# Patient Record
Sex: Female | Born: 1950 | Race: White | Hispanic: No | Marital: Single | Smoking: Never smoker
Health system: Southern US, Community
[De-identification: ages and names within clinical notes are randomized; demographics above are authoritative.]

## PROBLEM LIST (undated history)

## (undated) DIAGNOSIS — E119 Type 2 diabetes mellitus without complications: Secondary | ICD-10-CM

## (undated) DIAGNOSIS — H3530 Unspecified macular degeneration: Secondary | ICD-10-CM

## (undated) DIAGNOSIS — N189 Chronic kidney disease, unspecified: Secondary | ICD-10-CM

## (undated) DIAGNOSIS — I10 Essential (primary) hypertension: Secondary | ICD-10-CM

## (undated) DIAGNOSIS — H35039 Hypertensive retinopathy, unspecified eye: Secondary | ICD-10-CM

## (undated) DIAGNOSIS — H269 Unspecified cataract: Secondary | ICD-10-CM

## (undated) HISTORY — DX: Hypertensive retinopathy, unspecified eye: H35.039

## (undated) HISTORY — DX: Chronic kidney disease, unspecified: N18.9

## (undated) HISTORY — DX: Type 2 diabetes mellitus without complications: E11.9

## (undated) HISTORY — PX: EYE SURGERY: SHX253

## (undated) HISTORY — PX: SPINAL FUSION: SHX223

## (undated) HISTORY — DX: Unspecified macular degeneration: H35.30

## (undated) HISTORY — PX: WRIST SURGERY: SHX841

## (undated) HISTORY — DX: Essential (primary) hypertension: I10

## (undated) HISTORY — PX: CATARACT EXTRACTION: SUR2

## (undated) HISTORY — DX: Unspecified cataract: H26.9

## (undated) HISTORY — PX: ANGIOPLASTY: SHX39

---

## 2017-04-01 DIAGNOSIS — N1831 Chronic kidney disease, stage 3a: Secondary | ICD-10-CM | POA: Insufficient documentation

## 2017-12-03 DIAGNOSIS — F3341 Major depressive disorder, recurrent, in partial remission: Secondary | ICD-10-CM | POA: Diagnosis not present

## 2017-12-03 DIAGNOSIS — E1169 Type 2 diabetes mellitus with other specified complication: Secondary | ICD-10-CM | POA: Diagnosis not present

## 2017-12-03 DIAGNOSIS — K219 Gastro-esophageal reflux disease without esophagitis: Secondary | ICD-10-CM | POA: Diagnosis not present

## 2017-12-03 DIAGNOSIS — K5790 Diverticulosis of intestine, part unspecified, without perforation or abscess without bleeding: Secondary | ICD-10-CM | POA: Diagnosis not present

## 2017-12-03 DIAGNOSIS — I10 Essential (primary) hypertension: Secondary | ICD-10-CM | POA: Diagnosis not present

## 2017-12-03 DIAGNOSIS — R011 Cardiac murmur, unspecified: Secondary | ICD-10-CM | POA: Diagnosis not present

## 2017-12-03 DIAGNOSIS — F411 Generalized anxiety disorder: Secondary | ICD-10-CM | POA: Diagnosis not present

## 2017-12-03 DIAGNOSIS — Z95820 Peripheral vascular angioplasty status with implants and grafts: Secondary | ICD-10-CM | POA: Diagnosis not present

## 2017-12-03 DIAGNOSIS — E785 Hyperlipidemia, unspecified: Secondary | ICD-10-CM | POA: Diagnosis not present

## 2017-12-03 DIAGNOSIS — N3281 Overactive bladder: Secondary | ICD-10-CM | POA: Diagnosis not present

## 2017-12-03 DIAGNOSIS — I2510 Atherosclerotic heart disease of native coronary artery without angina pectoris: Secondary | ICD-10-CM | POA: Diagnosis not present

## 2017-12-03 DIAGNOSIS — N184 Chronic kidney disease, stage 4 (severe): Secondary | ICD-10-CM | POA: Diagnosis not present

## 2018-01-15 ENCOUNTER — Encounter: Payer: Self-pay | Admitting: Cardiovascular Disease

## 2018-01-15 ENCOUNTER — Ambulatory Visit (INDEPENDENT_AMBULATORY_CARE_PROVIDER_SITE_OTHER): Payer: PPO | Admitting: Cardiovascular Disease

## 2018-01-15 DIAGNOSIS — E7800 Pure hypercholesterolemia, unspecified: Secondary | ICD-10-CM

## 2018-01-15 DIAGNOSIS — E118 Type 2 diabetes mellitus with unspecified complications: Secondary | ICD-10-CM | POA: Insufficient documentation

## 2018-01-15 DIAGNOSIS — I10 Essential (primary) hypertension: Secondary | ICD-10-CM | POA: Diagnosis not present

## 2018-01-15 DIAGNOSIS — I2510 Atherosclerotic heart disease of native coronary artery without angina pectoris: Secondary | ICD-10-CM | POA: Diagnosis not present

## 2018-01-15 DIAGNOSIS — Z794 Long term (current) use of insulin: Secondary | ICD-10-CM | POA: Diagnosis not present

## 2018-01-15 DIAGNOSIS — E785 Hyperlipidemia, unspecified: Secondary | ICD-10-CM | POA: Insufficient documentation

## 2018-01-15 NOTE — Assessment & Plan Note (Signed)
History of essential hypertension with blood pressure measured today at 138/62.  She is on enalapril and nifedipine.

## 2018-01-15 NOTE — Assessment & Plan Note (Signed)
Patient hyperlipidemia on statin therapy with recent lipid profile performed 12/03/2017 revealing to cholesterol 207, LDL 58 and HDL of 96.

## 2018-01-15 NOTE — Patient Instructions (Signed)
Medication Instructions:  Your physician recommends that you continue on your current medications as directed. Please refer to the Current Medication list given to you today.  If you need a refill on your cardiac medications before your next appointment, please call your pharmacy.   Lab work: none If you have labs (blood work) drawn today and your tests are completely normal, you will receive your results only by: Marland Kitchen MyChart Message (if you have MyChart) OR . A paper copy in the mail If you have any lab test that is abnormal or we need to change your treatment, we will call you to review the results.  Testing/Procedures: none  Follow-Up: At [facility], you and your health needs are our priority.  As part of our continuing mission to provide you with exceptional heart care, we have created designated Provider Care Teams.  These Care Teams include your primary Cardiologist (physician) and Advanced Practice Providers (APPs -  Physician Assistants and Nurse Practitioners) who all work together to provide you with the care you need, when you need it. You will need a follow up appointment in 12 months.  Please call our office 2 months in advance to schedule this appointment.  You may see Dr. Gwenlyn Found or one of the following Advanced Practice Providers on your designated Care Team:   Kerin Ransom, PA-C Roby Lofts, [place] . Sande Rives, PA-C  Any Other Special Instructions Will Be Listed Below (If Applicable).

## 2018-01-15 NOTE — Assessment & Plan Note (Signed)
History of CAD status post stenting with Taxus express drug-eluting stent at [facility] prior to 2010 with stenting again in the LAD 2/29/2010 with a 2.5 mm x 12 mm long Endeavor DES and a 3 mm x 18 mm long Endeavor DES.  She has never had a heart attack.  She denies chest pain or shortness of breath.  She had a recent 2D echo performed by her cardiologist in the [place] that was essentially normal as well as a Myoview stress test that was normal as well 08/26/2017.

## 2018-01-15 NOTE — Progress Notes (Signed)
01/15/2018 Kimberly Rivers   [birth year]  [id number]  Primary Physician No primary care provider on file. Primary Cardiologist: Lorretta Harp MD [name], [place], [place]  HPI:  Kimberly Rivers is a 67 y.o. mildly overweight single Caucasian female with no children who recently relocated from the [place] to [facility] because of retirement and cost of living.  She was referred by Dr. Lynelle Doctor, her PCP, to be established in my practice for ongoing cardiovascular care.  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  Risk factors include treated hypertension, diabetes and lipidemia.  Her mother did have a CABG.  She is never had a heart attack or stroke.  She had stenting at [facility] before 2010 with a Taxus express drug-eluting stent and then stenting 2/29/2010 with 2 Endeavor stents in her LAD.  She had a normal 2D echo and Myoview stress test by her cardiologist in [place] 08/26/2017.  She denies chest pain or shortness of breath.  Current Meds  Medication Sig  . aspirin EC 81 MG tablet Take 81 mg by mouth daily.  . citalopram (CELEXA) 40 MG tablet Take 40 mg by mouth daily.  Marland Kitchen CRANBERRY PO Take 4,200 capsules by mouth daily.  . enalapril (VASOTEC) 20 MG tablet Take 1 tablet by mouth daily.  . Ferrous Sulfate (IRON) 325 (65 Fe) MG TABS Take 1 tablet by mouth daily.  Marland Kitchen gabapentin (NEURONTIN) 600 MG tablet Take 600 mg by mouth at bedtime.  . Lactobacillus (PROBIOTIC ACIDOPHILUS PO) Take by mouth.  . Multiple Vitamin (MULTIVITAMIN) tablet Take 1 tablet by mouth daily.  Marland Kitchen NIFEdipine (ADALAT CC) 60 MG 24 hr tablet Take 60 mg by mouth daily.  Marland Kitchen oxybutynin (DITROPAN-XL) 5 MG 24 hr tablet Take 5 mg by mouth at bedtime.  . ranitidine (ZANTAC) 150 MG tablet Take 150 mg by mouth as directed.  . simvastatin (ZOCOR) 10 MG tablet Take 10 mg by mouth daily.  . vitamin C (ASCORBIC ACID) 500 MG tablet Take 500 mg by mouth daily.     Allergies  Allergen Reactions  .  Clindamycin/Lincomycin   . Doxycycline   . Penicillins     Social History   Socioeconomic History  . Marital status: Single    Spouse name: Not on file  . Number of children: Not on file  . Years of education: Not on file  . Highest education level: Not on file  Occupational History  . Not on file  Social Needs  . Financial resource strain: Not on file  . Food insecurity:    Worry: Not on file    Inability: Not on file  . Transportation needs:    Medical: Not on file    Non-medical: Not on file  Tobacco Use  . Smoking status: Never Smoker  . Smokeless tobacco: Never Used  Substance and Sexual Activity  . Alcohol use: Not on file  . Drug use: Not on file  . Sexual activity: Not on file  Lifestyle  . Physical activity:    Days per week: Not on file    Minutes per session: Not on file  . Stress: Not on file  Relationships  . Social connections:    Talks on phone: Not on file    Gets together: Not on file    Attends religious service: Not on file    Active member of club or organization: Not on file    Attends meetings of  clubs or organizations: Not on file    Relationship status: Not on file  . Intimate partner violence:    Fear of current or ex partner: Not on file    Emotionally abused: Not on file    Physically abused: Not on file    Forced sexual activity: Not on file  Other Topics Concern  . Not on file  Social History Narrative  . Not on file     Review of Systems: General: negative for chills, fever, night sweats or weight changes.  Cardiovascular: negative for chest pain, dyspnea on exertion, edema, orthopnea, palpitations, paroxysmal nocturnal dyspnea or shortness of breath Dermatological: negative for rash Respiratory: negative for cough or wheezing Urologic: negative for hematuria Abdominal: negative for nausea, vomiting, diarrhea, bright red blood per rectum, melena, or hematemesis Neurologic: negative for visual changes, syncope, or  dizziness All other systems reviewed and are otherwise negative except as noted above.    Blood pressure 138/62, pulse (!) 46, height 4\' 11"  (1.499 m), weight 112 lb 9.6 oz (51.1 kg).  General appearance: alert and no distress Neck: no adenopathy, no carotid bruit, no JVD, supple, symmetrical, trachea midline and thyroid not enlarged, symmetric, no tenderness/mass/nodules Lungs: clear to auscultation bilaterally Heart: regular rate and rhythm, S1, S2 normal, no murmur, click, rub or gallop Extremities: extremities normal, atraumatic, no cyanosis or edema Pulses: 2+ and symmetric Skin: Skin color, texture, turgor normal. No rashes or lesions Neurologic: Alert and oriented X 3, normal strength and tone. Normal symmetric reflexes. Normal coordination and gait  EKG sinus bradycardia at 46 with right bundle branch block.  I personally reviewed this EKG.  ASSESSMENT AND PLAN:   Coronary artery disease History of CAD status post stenting with Taxus express drug-eluting stent at [facility] prior to 2010 with stenting again in the LAD 2/29/2010 with a 2.5 mm x 12 mm long Endeavor DES and a 3 mm x 18 mm long Endeavor DES.  She has never had a heart attack.  She denies chest pain or shortness of breath.  She had a recent 2D echo performed by her cardiologist in the [place] that was essentially normal as well as a Myoview stress test that was normal as well 08/26/2017.  Essential hypertension History of essential hypertension with blood pressure measured today at 138/62.  She is on enalapril and nifedipine.  Hyperlipidemia Patient hyperlipidemia on statin therapy with recent lipid profile performed 12/03/2017 revealing to cholesterol 207, LDL 58 and HDL of 96.      Lorretta Harp MD FACP,FACC,FAHA, [facility] 01/15/2018 9:45 AM

## 2018-02-26 DIAGNOSIS — E1122 Type 2 diabetes mellitus with diabetic chronic kidney disease: Secondary | ICD-10-CM | POA: Diagnosis not present

## 2018-02-26 DIAGNOSIS — I129 Hypertensive chronic kidney disease with stage 1 through stage 4 chronic kidney disease, or unspecified chronic kidney disease: Secondary | ICD-10-CM | POA: Diagnosis not present

## 2018-02-26 DIAGNOSIS — N183 Chronic kidney disease, stage 3 (moderate): Secondary | ICD-10-CM | POA: Diagnosis not present

## 2018-02-26 DIAGNOSIS — I2510 Atherosclerotic heart disease of native coronary artery without angina pectoris: Secondary | ICD-10-CM | POA: Diagnosis not present

## 2018-03-04 DIAGNOSIS — F411 Generalized anxiety disorder: Secondary | ICD-10-CM | POA: Diagnosis not present

## 2018-03-04 DIAGNOSIS — I10 Essential (primary) hypertension: Secondary | ICD-10-CM | POA: Diagnosis not present

## 2018-03-04 DIAGNOSIS — K5790 Diverticulosis of intestine, part unspecified, without perforation or abscess without bleeding: Secondary | ICD-10-CM | POA: Diagnosis not present

## 2018-03-04 DIAGNOSIS — Z7984 Long term (current) use of oral hypoglycemic drugs: Secondary | ICD-10-CM | POA: Diagnosis not present

## 2018-03-04 DIAGNOSIS — N184 Chronic kidney disease, stage 4 (severe): Secondary | ICD-10-CM | POA: Diagnosis not present

## 2018-03-04 DIAGNOSIS — Z95820 Peripheral vascular angioplasty status with implants and grafts: Secondary | ICD-10-CM | POA: Diagnosis not present

## 2018-03-04 DIAGNOSIS — I2510 Atherosclerotic heart disease of native coronary artery without angina pectoris: Secondary | ICD-10-CM | POA: Diagnosis not present

## 2018-03-04 DIAGNOSIS — E785 Hyperlipidemia, unspecified: Secondary | ICD-10-CM | POA: Diagnosis not present

## 2018-03-04 DIAGNOSIS — K219 Gastro-esophageal reflux disease without esophagitis: Secondary | ICD-10-CM | POA: Diagnosis not present

## 2018-03-04 DIAGNOSIS — E1169 Type 2 diabetes mellitus with other specified complication: Secondary | ICD-10-CM | POA: Diagnosis not present

## 2018-03-04 DIAGNOSIS — N3281 Overactive bladder: Secondary | ICD-10-CM | POA: Diagnosis not present

## 2018-03-04 DIAGNOSIS — F3341 Major depressive disorder, recurrent, in partial remission: Secondary | ICD-10-CM | POA: Diagnosis not present

## 2018-03-05 DIAGNOSIS — N183 Chronic kidney disease, stage 3 (moderate): Secondary | ICD-10-CM | POA: Diagnosis not present

## 2018-04-11 NOTE — Progress Notes (Signed)
[facility] Note  04/12/2018     CHIEF COMPLAINT Patient presents for Retina Evaluation and Diabetic Eye Exam   HISTORY OF PRESENT ILLNESS: Kimberly Rivers is a 68 y.o. female who presents to the clinic today for:   HPI    Retina Evaluation    In both eyes.  This started 1 month ago.  Associated Symptoms Floaters.  Negative for Glare, Shoulder/Hip pain, Blind Spot, Distortion, Photophobia, Jaw Claudication, Fatigue, Weight Loss, Scalp Tenderness, Redness, Flashes, Pain, Trauma and Fever.  Context:  distance vision, mid-range vision and near vision.  Treatments tried include surgery.  Response to treatment was mild improvement.  I, the attending physician,  performed the HPI with the patient and updated documentation appropriately.          Diabetic Eye Exam    Vision is stable.  Associated Symptoms Negative for Flashes, Pain, Trauma, Fever, Weight Loss, Scalp Tenderness, Redness, Floaters, Distortion, Photophobia, Jaw Claudication, Fatigue, Shoulder/Hip pain, Glare and Blind Spot.  Diabetes characteristics include Type 2, taking oral medications and controlled with diet.  This started 30 years ago.  Blood sugar level is controlled.  Last Blood Glucose 126.  Associated Diagnosis Kidney Disease.  I, the attending physician,  performed the HPI with the patient and updated documentation appropriately.          Comments    Referral of Dr. Baird Lyons for retina/DM Eval.Patient states she has had floaters OD for appx 3 yrs, denies flashes of light, ocular pain and photophobia.Pt is DM2 x 30 yrs's, BS 126 this am. A1C (unknown results ), WINL per patient. BS are controlled by diet, if BS above 200 she is to take Regaglinide 5mg . Pt is using Thera Tears and ReFresh gtt's PRN        Last edited by Bernarda Caffey, MD on 04/12/2018  2:51 PM. (History)    s/p cataract sx OS only around 2017 by Dr. Ernst Spell in [place]  Referring physician: Pa, [place] [address], [place] [address]  HISTORICAL INFORMATION:   Selected notes from the MEDICAL RECORD NUMBER Referred by [facility] for DM exam LEE: 06.17.19 (Dr. Nada Libman, MD) [BCVA: OD: 20/25 OS: 20/30--]  Ocular Hx-non-exu ARMD, cataract OS, pseudo OD PMH-DM, CKD, HTN    CURRENT MEDICATIONS: No current outpatient medications on file. (Ophthalmic Drugs)   No current facility-administered medications for this visit.  (Ophthalmic Drugs)   Current Outpatient Medications (Other)  Medication Sig  . aspirin EC 81 MG tablet Take 81 mg by mouth daily.  . citalopram (CELEXA) 40 MG tablet Take 40 mg by mouth daily.  Marland Kitchen CRANBERRY PO Take 4,200 capsules by mouth daily.  . enalapril (VASOTEC) 20 MG tablet Take 1 tablet by mouth daily.  . Ferrous Sulfate (IRON) 325 (65 Fe) MG TABS Take 1 tablet by mouth daily.  Marland Kitchen gabapentin (NEURONTIN) 600 MG tablet Take 600 mg by mouth at bedtime.  . Lactobacillus (PROBIOTIC ACIDOPHILUS PO) Take by mouth.  Marland Kitchen NIFEdipine (ADALAT CC) 60 MG 24 hr tablet Take 60 mg by mouth daily.  Marland Kitchen oxybutynin (DITROPAN-XL) 5 MG 24 hr tablet Take 5 mg by mouth at bedtime.  . ranitidine (ZANTAC) 150 MG tablet Take 150 mg by mouth as directed.  . simvastatin (ZOCOR) 10 MG tablet Take 10 mg by mouth daily.  . vitamin C (ASCORBIC ACID) 500 MG tablet Take 500 mg by mouth daily.  . Multiple Vitamin (MULTIVITAMIN)  tablet Take 1 tablet by mouth daily.   No current facility-administered medications for this visit.  (Other)      REVIEW OF SYSTEMS: ROS    Positive for: Endocrine, Eyes   Negative for: Constitutional, Gastrointestinal, Neurological, Skin, Genitourinary, Musculoskeletal, HENT, Cardiovascular, Respiratory, Psychiatric, Allergic/Imm, Heme/Lymph   Last edited by Zenovia Jordan, LPN on 1/61/0960  4:54 PM. (History)       ALLERGIES Allergies  Allergen Reactions  . Clindamycin/Lincomycin   . Doxycycline   . Penicillins      PAST MEDICAL HISTORY Past Medical History:  Diagnosis Date  . Chronic kidney disease   . Diabetes mellitus without complication ([place])   . Hypertension    Past Surgical History:  Procedure Laterality Date  . CATARACT EXTRACTION    . EYE SURGERY      FAMILY HISTORY Family History  Problem Relation Age of Onset  . Hypertension Mother   . Heart disease Mother   . Diabetes Mother   . [place] disease Father   . Heart disease Maternal Grandmother   . Heart disease Maternal Grandfather   . Kidney failure Maternal Grandfather     SOCIAL HISTORY Social History   Tobacco Use  . Smoking status: Never Smoker  . Smokeless tobacco: Never Used  Substance Use Topics  . Alcohol use: Not on file  . Drug use: Not on file         OPHTHALMIC EXAM:  Base Eye Exam    Visual Acuity (Snellen - Linear)      Right Left   Dist cc 20/40 +2 20/30 -2   Dist ph cc NI 20/25 -2   Correction:  Glasses       Tonometry (Tonopen, 2:14 PM)      Right Left   Pressure 14 10       Pupils      Dark Light Shape React APD   Right 4 3 Round Brisk None   Left 4 3 Round Brisk None       Visual Fields (Counting fingers)      Left Right    Full Full       Extraocular Movement      Right Left    Full, Ortho Full, Ortho       Neuro/Psych    Oriented x3:  Yes       Dilation    Both eyes:  1.0% Mydriacyl, 2.5% Phenylephrine @ 2:08 PM        Slit Lamp and Fundus Exam    Slit Lamp Exam      Right Left   Lids/Lashes Dermatochalasis - upper lid Dermatochalasis - upper lid   Conjunctiva/Sclera White and quiet White and quiet   Cornea Arcus, 1-2+ Punctate epithelial erosions Arcus, 1-2+ Punctate epithelial erosions, Well healed cataract wounds   Anterior Chamber deep and clear deep and clear   Iris Round and dilated, No NVI Round and dilated, No NVI   Lens 2-3+ Nuclear sclerosis, Cortical cataract PC IOL in good position with open PC   Vitreous Vitreous syneresis, Posterior vitreous  detachment Vitreous syneresis, Posterior vitreous detachment       Fundus Exam      Right Left   Disc Compact, Peripapillary atrophy Compact, mild tilt, temporal PPA   C/D Ratio 0.1 0.1   Macula Flat, Blunted foveal reflex, RPE atrophy and clumping, Drusen Blunted foveal reflex, fine drusen, mild Retinal pigment epithelial mottling, No heme or edema   Vessels Vascular attenuation, Tortuous, AV crossing  changes Vascular attenuation, Tortuous, AV crossing changes   Periphery Attached, No heme  Attached, No heme, peripheral cystoid degeneration          IMAGING AND PROCEDURES  Imaging and Procedures for @TODAY @  OCT, Retina - OU - Both Eyes       Right Eye Quality was good. Central Foveal Thickness: 286. Progression has no prior data. Findings include normal foveal contour, no SRF, no IRF, pigment epithelial detachment, outer retinal atrophy, retinal drusen .   Left Eye Quality was good. Central Foveal Thickness: 274. Progression has no prior data. Findings include normal foveal contour, no SRF, no IRF, retinal drusen .   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 CME - cystoid macular edema. PED - pigment epithelial detachment. IRF - intraretinal fluid. SRF - subretinal fluid. EZ - ellipsoid zone. ERM - epiretinal membrane. ORA - outer retinal atrophy. ORT - outer retinal tubulation. SRHM - subretinal hyper-reflective material                 ASSESSMENT/PLAN:    ICD-10-CM   1. Diabetes mellitus type 2 without retinopathy ([place]) E11.9   2. Intermediate stage nonexudative age-related macular degeneration of both eyes H35.3132   3. Retinal edema H35.81 OCT, Retina - OU - Both Eyes  4. 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 Essential hypertension I10   6.  Hypertensive retinopathy of both eyes H35.033   7. Pseudophakia of left eye Z96.1   8. 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 Diabetes mellitus, type 2 without retinopathy, OU  - The incidence, risk factors for progression, natural history and treatment options for diabetic retinopathy  were discussed with patient.    - The need for close monitoring of blood glucose, blood pressure, and serum lipids, avoiding cigarette or any type of tobacco, and the need for long term follow up was also discussed with patient.  - f/u in 1 year, sooner prn  2,3. Age related macular degeneration, non-exudative, both eyes  - The incidence, anatomy, and pathology of dry AMD, risk of progression, and the AREDS and AREDS 2 study including smoking risks discussed with patient.  - Recommend amsler grid monitoring  - f/u 4 months  4. Myopic degeneration OD  - mild low lying PED w/ overlying ORA nasal to fovea  - stable without IRF/SRF  - monitor  5,6. Hypertensive retinopathy OU - discussed importance of tight BP control - monitor  7. Pseudophakia OS  - s/p CE/IOL and YAG cap OS  - beautiful surgeries, doing well  - surgery done around 2017, Dr. Caleen Essex. Ernst Spell in [place]  - monitor  8. Mixed cataract OD - The symptoms of cataract, surgical options, and treatments and risks were discussed with patient. - discussed diagnosis and progression - approaching visual significance - will refer to Dr. Gala Romney at [facility] for evaluation   Ophthalmic Meds Ordered this visit:  No orders of the defined types were placed in this encounter.      Return in about 4 months (around 08/11/2018) for f/u non-exu ARMD OU, DFE, OCT.  There are no Patient Instructions on file for this visit.   Explained the diagnoses, plan, and follow up with  the patient and they expressed understanding.  Patient expressed understanding of the importance of proper follow up care.   This  document serves as a record of services personally performed by Gardiner Sleeper, MD, PhD. It was created on their behalf by Ernest Mallick, OA, an ophthalmic assistant. The creation of this record is the provider's dictation and/or activities during the visit.    Electronically signed by: Ernest Mallick, OA  01.26.2020 4:27 PM    Gardiner Sleeper, M.D., Ph.D. Diseases & Surgery of the Retina and Vitreous [facility]  I have reviewed the above documentation for accuracy and completeness, and I agree with the above. Gardiner Sleeper, M.D., Ph.D. 04/12/18 4:27 PM    Abbreviations: M myopia (nearsighted); A astigmatism; H hyperopia (farsighted); P presbyopia; Mrx spectacle prescription;  CTL contact lenses; OD right eye; OS left eye; OU both eyes  XT exotropia; ET esotropia; PEK punctate epithelial keratitis; PEE punctate epithelial erosions; DES dry eye syndrome; MGD meibomian gland dysfunction; ATs artificial tears; PFAT's preservative free artificial tears; [place] nuclear sclerotic cataract; PSC posterior subcapsular cataract; ERM epi-retinal membrane; PVD posterior vitreous detachment; RD retinal detachment; DM diabetes mellitus; DR diabetic retinopathy; NPDR non-proliferative diabetic retinopathy; PDR proliferative diabetic retinopathy; CSME clinically significant macular edema; DME diabetic macular edema; dbh dot blot hemorrhages; CWS cotton wool spot; POAG primary open angle glaucoma; C/D cup-to-disc ratio; HVF humphrey visual field; GVF goldmann visual field; OCT optical coherence tomography; IOP intraocular pressure; BRVO Branch retinal vein occlusion; CRVO central retinal vein occlusion; CRAO central retinal artery occlusion; BRAO branch retinal artery occlusion; RT retinal tear; SB scleral buckle; PPV pars plana vitrectomy; VH Vitreous hemorrhage; PRP panretinal laser photocoagulation; IVK intravitreal kenalog; VMT vitreomacular traction; MH Macular hole;  NVD neovascularization of  the disc; NVE neovascularization elsewhere; AREDS age related eye disease study; ARMD age related macular degeneration; POAG primary open angle glaucoma; EBMD epithelial/anterior basement membrane dystrophy; ACIOL anterior chamber intraocular lens; IOL intraocular lens; PCIOL posterior chamber intraocular lens; Phaco/IOL phacoemulsification with intraocular lens placement; [place] photorefractive keratectomy; LASIK laser assisted in situ keratomileusis; HTN hypertension; DM diabetes mellitus; COPD chronic obstructive pulmonary disease

## 2018-04-12 ENCOUNTER — Ambulatory Visit (INDEPENDENT_AMBULATORY_CARE_PROVIDER_SITE_OTHER): Payer: PPO | Admitting: Ophthalmology

## 2018-04-12 ENCOUNTER — Encounter (INDEPENDENT_AMBULATORY_CARE_PROVIDER_SITE_OTHER): Payer: Self-pay | Admitting: Ophthalmology

## 2018-04-12 DIAGNOSIS — Z961 Presence of intraocular lens: Secondary | ICD-10-CM | POA: Diagnosis not present

## 2018-04-12 DIAGNOSIS — H35033 Hypertensive retinopathy, bilateral: Secondary | ICD-10-CM | POA: Diagnosis not present

## 2018-04-12 DIAGNOSIS — H25811 Combined forms of age-related cataract, right eye: Secondary | ICD-10-CM

## 2018-04-12 DIAGNOSIS — I10 Essential (primary) hypertension: Secondary | ICD-10-CM

## 2018-04-12 DIAGNOSIS — E119 Type 2 diabetes mellitus without complications: Secondary | ICD-10-CM

## 2018-04-12 DIAGNOSIS — H353132 Nonexudative age-related macular degeneration, bilateral, intermediate dry stage: Secondary | ICD-10-CM | POA: Diagnosis not present

## 2018-04-12 DIAGNOSIS — H4421 Degenerative myopia, right eye: Secondary | ICD-10-CM

## 2018-04-12 DIAGNOSIS — H3581 Retinal edema: Secondary | ICD-10-CM | POA: Diagnosis not present

## 2018-04-26 DIAGNOSIS — H25811 Combined forms of age-related cataract, right eye: Secondary | ICD-10-CM | POA: Diagnosis not present

## 2018-04-26 DIAGNOSIS — Z961 Presence of intraocular lens: Secondary | ICD-10-CM | POA: Diagnosis not present

## 2018-04-26 DIAGNOSIS — H353132 Nonexudative age-related macular degeneration, bilateral, intermediate dry stage: Secondary | ICD-10-CM | POA: Diagnosis not present

## 2018-04-26 DIAGNOSIS — E119 Type 2 diabetes mellitus without complications: Secondary | ICD-10-CM | POA: Diagnosis not present

## 2018-08-10 NOTE — Progress Notes (Signed)
[facility] Note  08/11/2018     CHIEF COMPLAINT Patient presents for Retina Follow Up   HISTORY OF PRESENT ILLNESS: Kimberly Rivers is a 68 y.o. female who presents to the clinic today for:   HPI    Retina Follow Up    Patient presents with  Dry AMD.  In both eyes.  This started 5 months ago.  Since onset it is stable.  I, the attending physician,  performed the HPI with the patient and updated documentation appropriately.          Comments    4 mos F/U DM/ EXU/ ARMD OU. Patient states her vision has been "good", occasional floaters OD , denies new visual issues/onsets. Bs 117 , pt is schedules for labs/ A1C in two weeks.       Last edited by Bernarda Caffey, MD on 08/11/2018  2:06 PM. (History)    pt states she saw Dr. Eulas Post for cataract consult and they decided she doesn't need sx right now, pt states she is not taking vitamins for macula degeneration, pt states her nephrologist told her to stop taking multivitamins bc they were hurting her kidneys  Referring physician: Nada Libman, FNP [place], [place] [id number]  HISTORICAL INFORMATION:   Selected notes from the MEDICAL RECORD NUMBER Referred by [facility] for DM exam LEE: 06.17.19 (Dr. Nada Libman, MD) [BCVA: OD: 20/25 OS: 20/30--]  Ocular Hx-non-exu ARMD, cataract OS, pseudo OD PMH-DM, CKD, HTN    CURRENT MEDICATIONS: No current outpatient medications on file. (Ophthalmic Drugs)   No current facility-administered medications for this visit.  (Ophthalmic Drugs)   Current Outpatient Medications (Other)  Medication Sig  . aspirin EC 81 MG tablet Take 81 mg by mouth daily.  . citalopram (CELEXA) 40 MG tablet Take 40 mg by mouth daily.  Marland Kitchen CRANBERRY PO Take 4,200 capsules by mouth daily.  . enalapril (VASOTEC) 20 MG tablet Take 1 tablet by mouth daily.  . Ferrous Sulfate (IRON) 325 (65 Fe) MG TABS Take 1 tablet by mouth daily.  Marland Kitchen gabapentin (NEURONTIN) 600 MG  tablet Take 600 mg by mouth at bedtime.  . Lactobacillus (PROBIOTIC ACIDOPHILUS PO) Take by mouth.  . Multiple Vitamin (MULTIVITAMIN) tablet Take 1 tablet by mouth daily.  Marland Kitchen NIFEdipine (ADALAT CC) 60 MG 24 hr tablet Take 60 mg by mouth daily.  Marland Kitchen oxybutynin (DITROPAN-XL) 5 MG 24 hr tablet Take 5 mg by mouth at bedtime.  . ranitidine (ZANTAC) 150 MG tablet Take 150 mg by mouth as directed.  . simvastatin (ZOCOR) 10 MG tablet Take 10 mg by mouth daily.  . vitamin C (ASCORBIC ACID) 500 MG tablet Take 500 mg by mouth daily.   No current facility-administered medications for this visit.  (Other)      REVIEW OF SYSTEMS: ROS    Positive for: Endocrine, Eyes   Negative for: Constitutional, Gastrointestinal, Neurological, Skin, Genitourinary, Musculoskeletal, HENT, Cardiovascular, Respiratory, Psychiatric, Allergic/Imm, Heme/Lymph   Last edited by Zenovia Jordan, LPN on 9/51/8841  6:60 PM. (History)       ALLERGIES Allergies  Allergen Reactions  . Clindamycin/Lincomycin   . Doxycycline   . Penicillins     PAST MEDICAL HISTORY Past Medical History:  Diagnosis Date  . Chronic kidney disease   . Diabetes mellitus without complication ([place])   . Hypertension    Past Surgical History:  Procedure Laterality Date  . CATARACT EXTRACTION    . EYE SURGERY  FAMILY HISTORY Family History  Problem Relation Age of Onset  . Hypertension Mother   . Heart disease Mother   . Diabetes Mother   . Canavan disease Father   . Heart disease Maternal Grandmother   . Heart disease Maternal Grandfather   . Kidney failure Maternal Grandfather     SOCIAL HISTORY Social History   Tobacco Use  . Smoking status: Never Smoker  . Smokeless tobacco: Never Used  Substance Use Topics  . Alcohol use: Not on file  . Drug use: Not on file         OPHTHALMIC EXAM:  Base Eye Exam    Visual Acuity (Snellen - Linear)      Right Left   Dist cc 20/30 +2 20/40 +1   Dist ph cc 20/25 -1 20/30  +1   Correction:  Glasses       Tonometry (Tonopen, 1:25 PM)      Right Left   Pressure 12 14       Pupils      Dark Light Shape React APD   Right 4 3 Round Brisk None   Left 4 3 Round Brisk None       Visual Fields (Counting fingers)      Left Right    Full Full       Extraocular Movement      Right Left    Full, Ortho Full, Ortho       Neuro/Psych    Oriented x3:  Yes       Dilation    Both eyes:  1.0% Mydriacyl, 2.5% Phenylephrine @ 1:25 PM        Slit Lamp and Fundus Exam    Slit Lamp Exam      Right Left   Lids/Lashes Dermatochalasis - upper lid Dermatochalasis - upper lid   Conjunctiva/Sclera White and quiet White and quiet   Cornea Arcus, 1+ Punctate epithelial erosions, trace Descemet's folds Arcus, trace Punctate epithelial erosions, Well healed cataract wounds, trace Descemet's folds   Anterior Chamber deep and clear deep and clear   Iris Round and dilated, No NVI Round and dilated, No NVI   Lens 2-3+ Nuclear sclerosis, 2+Cortical cataract PC IOL in good position with PC opening   Vitreous Vitreous syneresis, Posterior vitreous detachment Vitreous syneresis, Posterior vitreous detachment       Fundus Exam      Right Left   Disc Compact, Peripapillary atrophy, Pink and Sharp Compact, mild tilt, temporal PPA   C/D Ratio 0.1 0.3   Macula Flat, Blunted foveal reflex, RPE mottling and clumping, focal atrophy nasal to disc, Drusen, no heme Blunted foveal reflex, fine drusen, mild Retinal pigment epithelial mottling, No heme or edema   Vessels Vascular attenuation, Tortuous Vascular attenuation, Tortuous, AV crossing changes   Periphery Attached, No heme  Attached, No heme, peripheral cystoid degeneration          IMAGING AND PROCEDURES  Imaging and Procedures for @TODAY @  OCT, Retina - OU - Both Eyes       Right Eye Quality was good. Central Foveal Thickness: 286. Progression has been stable. Findings include normal foveal contour, no SRF, no IRF,  pigment epithelial detachment, outer retinal atrophy, retinal drusen .   Left Eye Quality was good. Central Foveal Thickness: 273. Progression has been stable. Findings include normal foveal contour, no SRF, no IRF, retinal drusen .   Notes *Images captured and stored on drive  Diagnosis / Impression:  Non-exu ARMD OU (  OD > OS) OD with low lying PED and mild ORA nasal macula OS: fine drusen No DME OU  Clinical management:  See below  Abbreviations: NFP - Normal foveal profile. CME - cystoid macular edema. PED - pigment epithelial detachment. IRF - intraretinal fluid. SRF - subretinal fluid. EZ - ellipsoid zone. ERM - epiretinal membrane. ORA - outer retinal atrophy. ORT - outer retinal tubulation. SRHM - subretinal hyper-reflective material                 ASSESSMENT/PLAN:    ICD-10-CM   1. Diabetes mellitus type 2 without retinopathy ([place]) E11.9   2. Intermediate stage nonexudative age-related macular degeneration of both eyes H35.3132   3. Retinal edema H35.81 OCT, Retina - OU - Both Eyes  4. 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 Essential hypertension I10   6. Hypertensive retinopathy of both eyes H35.033   7. Pseudophakia of left eye Z96.1   8. 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 Diabetes mellitus, type 2 without retinopathy, OU  - The incidence, risk factors for progression, natural history and treatment options for diabetic retinopathy  were discussed with patient.    - The need for close monitoring of blood glucose, blood pressure, and serum lipids, avoiding cigarette or any type of tobacco, and the need for long term follow up was also discussed with patient.  - monitor  2,3. Age related macular degeneration, non-exudative, both eyes -- stable  - The incidence, anatomy, and pathology of dry AMD, risk of progression, and the AREDS and AREDS 2 study including smoking risks discussed with patient.  - Recommend amsler grid  monitoring  - AREDS 2 sample given  - f/u 6 months  4. Myopic degeneration OD  - mild low lying PED w/ overlying ORA nasal to fovea  - stable without IRF/SRF  - monitor  5,6. Hypertensive retinopathy OU  - discussed importance of tight BP control  - monitor  7. Pseudophakia OS  - s/p CE/IOL and YAG cap OS  - beautiful surgeries, doing well  - surgery done around 2017, Dr. Caleen Essex. [facility] in [place]  - monitor  8. Mixed cataract OD  - The symptoms of cataract, surgical options, and treatments and risks were discussed with patient.  - discussed diagnosis and progression  - approaching visual significance  - evaluated [facility] G[facility] -- monitoring for now   Ophthalmic Meds Ordered this visit:  No orders of the defined types were placed in this encounter.      Return in about 6 months (around 02/11/2019) for f/u non-exu ARMD OU, DFE, OCT.  There are no Patient Instructions on file for this visit.   Explained the diagnoses, plan, and follow up with the patient and they expressed understanding.  Patient expressed understanding of the importance of proper follow up care.   This document serves as a record of services personally performed by Gardiner Sleeper, MD, PhD. It was created on their behalf by Ernest Mallick, OA, an ophthalmic assistant. The creation of this record is the provider's dictation and/or activities during the visit.    Electronically signed by: Ernest Mallick, OA  05.26.2020 8:39 PM    Gardiner Sleeper, M.D., Ph.D. Diseases & Surgery of the Retina and Vitreous [facility]  I have reviewed the above documentation for accuracy and completeness, and I agree with the above. Gardiner Sleeper, M.D.,  Ph.D. 08/11/18 8:39 PM     Abbreviations: M myopia (nearsighted); A astigmatism; H hyperopia (farsighted); P presbyopia; Mrx spectacle prescription;  CTL contact lenses; OD right eye; OS left eye; OU both eyes  XT  exotropia; ET esotropia; PEK punctate epithelial keratitis; PEE punctate epithelial erosions; DES dry eye syndrome; MGD meibomian gland dysfunction; ATs artificial tears; PFAT's preservative free artificial tears; [place] nuclear sclerotic cataract; PSC posterior subcapsular cataract; ERM epi-retinal membrane; PVD posterior vitreous detachment; RD retinal detachment; DM diabetes mellitus; DR diabetic retinopathy; NPDR non-proliferative diabetic retinopathy; PDR proliferative diabetic retinopathy; CSME clinically significant macular edema; DME diabetic macular edema; dbh dot blot hemorrhages; CWS cotton wool spot; POAG primary open angle glaucoma; C/D cup-to-disc ratio; HVF humphrey visual field; GVF goldmann visual field; OCT optical coherence tomography; IOP intraocular pressure; BRVO Branch retinal vein occlusion; CRVO central retinal vein occlusion; CRAO central retinal artery occlusion; BRAO branch retinal artery occlusion; RT retinal tear; SB scleral buckle; PPV pars plana vitrectomy; VH Vitreous hemorrhage; PRP panretinal laser photocoagulation; IVK intravitreal kenalog; VMT vitreomacular traction; MH Macular hole;  NVD neovascularization of the disc; NVE neovascularization elsewhere; AREDS age related eye disease study; ARMD age related macular degeneration; POAG primary open angle glaucoma; EBMD epithelial/anterior basement membrane dystrophy; ACIOL anterior chamber intraocular lens; IOL intraocular lens; PCIOL posterior chamber intraocular lens; Phaco/IOL phacoemulsification with intraocular lens placement; [place] photorefractive keratectomy; LASIK laser assisted in situ keratomileusis; HTN hypertension; DM diabetes mellitus; COPD chronic obstructive pulmonary disease

## 2018-08-11 ENCOUNTER — Ambulatory Visit (INDEPENDENT_AMBULATORY_CARE_PROVIDER_SITE_OTHER): Payer: PPO | Admitting: Ophthalmology

## 2018-08-11 ENCOUNTER — Encounter (INDEPENDENT_AMBULATORY_CARE_PROVIDER_SITE_OTHER): Payer: Self-pay | Admitting: Ophthalmology

## 2018-08-11 ENCOUNTER — Other Ambulatory Visit: Payer: Self-pay

## 2018-08-11 DIAGNOSIS — E119 Type 2 diabetes mellitus without complications: Secondary | ICD-10-CM | POA: Diagnosis not present

## 2018-08-11 DIAGNOSIS — H35033 Hypertensive retinopathy, bilateral: Secondary | ICD-10-CM | POA: Diagnosis not present

## 2018-08-11 DIAGNOSIS — Z961 Presence of intraocular lens: Secondary | ICD-10-CM | POA: Diagnosis not present

## 2018-08-11 DIAGNOSIS — H25811 Combined forms of age-related cataract, right eye: Secondary | ICD-10-CM | POA: Diagnosis not present

## 2018-08-11 DIAGNOSIS — I10 Essential (primary) hypertension: Secondary | ICD-10-CM

## 2018-08-11 DIAGNOSIS — H3581 Retinal edema: Secondary | ICD-10-CM | POA: Diagnosis not present

## 2018-08-11 DIAGNOSIS — H4421 Degenerative myopia, right eye: Secondary | ICD-10-CM

## 2018-08-11 DIAGNOSIS — H353132 Nonexudative age-related macular degeneration, bilateral, intermediate dry stage: Secondary | ICD-10-CM

## 2018-08-26 DIAGNOSIS — Z0000 Encounter for general adult medical examination without abnormal findings: Secondary | ICD-10-CM | POA: Diagnosis not present

## 2018-08-26 DIAGNOSIS — I10 Essential (primary) hypertension: Secondary | ICD-10-CM | POA: Diagnosis not present

## 2018-08-26 DIAGNOSIS — F411 Generalized anxiety disorder: Secondary | ICD-10-CM | POA: Diagnosis not present

## 2018-08-26 DIAGNOSIS — N184 Chronic kidney disease, stage 4 (severe): Secondary | ICD-10-CM | POA: Diagnosis not present

## 2018-08-26 DIAGNOSIS — N3281 Overactive bladder: Secondary | ICD-10-CM | POA: Diagnosis not present

## 2018-08-26 DIAGNOSIS — E1169 Type 2 diabetes mellitus with other specified complication: Secondary | ICD-10-CM | POA: Diagnosis not present

## 2018-08-26 DIAGNOSIS — E785 Hyperlipidemia, unspecified: Secondary | ICD-10-CM | POA: Diagnosis not present

## 2018-08-26 DIAGNOSIS — Z1159 Encounter for screening for other viral diseases: Secondary | ICD-10-CM | POA: Diagnosis not present

## 2018-08-26 DIAGNOSIS — Z95820 Peripheral vascular angioplasty status with implants and grafts: Secondary | ICD-10-CM | POA: Diagnosis not present

## 2018-09-02 DIAGNOSIS — R001 Bradycardia, unspecified: Secondary | ICD-10-CM | POA: Diagnosis not present

## 2018-09-02 DIAGNOSIS — I129 Hypertensive chronic kidney disease with stage 1 through stage 4 chronic kidney disease, or unspecified chronic kidney disease: Secondary | ICD-10-CM | POA: Diagnosis not present

## 2018-09-02 DIAGNOSIS — N183 Chronic kidney disease, stage 3 (moderate): Secondary | ICD-10-CM | POA: Diagnosis not present

## 2018-09-02 DIAGNOSIS — I2510 Atherosclerotic heart disease of native coronary artery without angina pectoris: Secondary | ICD-10-CM | POA: Diagnosis not present

## 2018-09-02 DIAGNOSIS — E1122 Type 2 diabetes mellitus with diabetic chronic kidney disease: Secondary | ICD-10-CM | POA: Diagnosis not present

## 2018-10-08 DIAGNOSIS — L259 Unspecified contact dermatitis, unspecified cause: Secondary | ICD-10-CM | POA: Diagnosis not present

## 2019-02-02 NOTE — Progress Notes (Signed)
[facility] Note  02/14/2019     CHIEF COMPLAINT Patient presents for Retina Follow Up   HISTORY OF PRESENT ILLNESS: Kimberly Rivers is a 68 y.o. female who presents to the clinic today for:   HPI    Retina Follow Up    Patient presents with  Dry AMD.  In both eyes.  This started 10 months ago.  Severity is moderate.  Duration of 6 months.  Since onset it is stable.  I, the attending physician,  performed the HPI with the patient and updated documentation appropriately.          Comments    68 y/o female pt here for 6 mo f/u for dry ARMD OU.  No change in [place] OU.  Denies pain, flashes, floaters.  AT prn OU.  BS 109 this a.m.  A1C unknown.       Last edited by Bernarda Caffey, MD on 02/14/2019  9:11 PM. (History)    pt states  Referring physician: Nada Libman, FNP [place],  [place] [phone]  HISTORICAL INFORMATION:   Selected notes from the MEDICAL RECORD NUMBER Referred by [facility] for DM exam LEE: 06.17.19 (Dr. Nada Libman, MD) [BCVA: OD: 20/25 OS: 20/30--]  Ocular Hx-non-exu ARMD, cataract OS, pseudo OD PMH-DM, CKD, HTN    CURRENT MEDICATIONS: No current outpatient medications on file. (Ophthalmic Drugs)   No current facility-administered medications for this visit.  (Ophthalmic Drugs)   Current Outpatient Medications (Other)  Medication Sig  . aspirin EC 81 MG tablet Take 81 mg by mouth daily.  . citalopram (CELEXA) 40 MG tablet Take 40 mg by mouth daily.  Marland Kitchen CRANBERRY PO Take 4,200 capsules by mouth daily.  . enalapril (VASOTEC) 20 MG tablet Take 1 tablet by mouth daily.  . Ferrous Sulfate (IRON) 325 (65 Fe) MG TABS Take 1 tablet by mouth daily.  Marland Kitchen gabapentin (NEURONTIN) 600 MG tablet Take 600 mg by mouth at bedtime.  . Lactobacillus (PROBIOTIC ACIDOPHILUS PO) Take by mouth.  . Multiple Vitamin (MULTIVITAMIN) tablet Take 1 tablet by mouth daily.  Marland Kitchen NIFEdipine (ADALAT CC) 60 MG 24 hr tablet Take 60 mg by  mouth daily.  [name] ULTRA test strip USE 1 STRIP 1 2 TIMES DAILY AS DIRECTED 50  . oxybutynin (DITROPAN-XL) 5 MG 24 hr tablet Take 5 mg by mouth at bedtime.  . ranitidine (ZANTAC) 150 MG tablet Take 150 mg by mouth as directed.  . simvastatin (ZOCOR) 20 MG tablet Take 20 mg by mouth at bedtime.  . triamcinolone ointment (KENALOG) 0.1 % APPLY TO AFFECTED AREA TWICE A DAY FOR 7 DAYS  . vitamin C (ASCORBIC ACID) 500 MG tablet Take 500 mg by mouth daily.  . enalapril (VASOTEC) 10 MG tablet Take 10 mg by mouth daily.  Marland Kitchen oxybutynin (DITROPAN) 5 MG tablet Take 5 mg by mouth 2 (two) times daily.  . simvastatin (ZOCOR) 10 MG tablet Take 10 mg by mouth daily.   No current facility-administered medications for this visit.  (Other)      REVIEW OF SYSTEMS: ROS    Positive for: Endocrine, Cardiovascular, Eyes   Negative for: Constitutional, Gastrointestinal, Neurological, Skin, Genitourinary, Musculoskeletal, HENT, Respiratory, Psychiatric, Allergic/Imm, Heme/Lymph   Last edited by Matthew Folks, COA on 02/14/2019  1:22 PM. (History)       ALLERGIES Allergies  Allergen Reactions  . Clindamycin/Lincomycin   . Doxycycline   . Penicillins     PAST MEDICAL  HISTORY Past Medical History:  Diagnosis Date  . Cataract    OD  . Chronic kidney disease   . Diabetes mellitus without complication ([place])   . Hypertension   . Hypertensive retinopathy    OU  . Macular degeneration    Dry OU   Past Surgical History:  Procedure Laterality Date  . CATARACT EXTRACTION Left   . EYE SURGERY Left    Cat Sx    FAMILY HISTORY Family History  Problem Relation Age of Onset  . Hypertension Mother   . Heart disease Mother   . Diabetes Mother   . Canavan disease Father   . Heart disease Maternal Grandmother   . Heart disease Maternal Grandfather   . Kidney failure Maternal Grandfather     SOCIAL HISTORY Social History   Tobacco Use  . Smoking status: Never Smoker  . Smokeless tobacco:  Never Used  Substance Use Topics  . Alcohol use: Not on file  . Drug use: Not on file         OPHTHALMIC EXAM:  Base Eye Exam    Visual Acuity (Snellen - Linear)      Right Left   Dist cc 20/30 -2 20/40   Dist ph cc NI 20/30   Correction: Glasses       Tonometry (Tonopen, 1:41 PM)      Right Left   Pressure 12 11       Pupils      Dark Light Shape React APD   Right 4 3 Round Brisk None   Left 4 3 Round Brisk None       Visual Fields (Counting fingers)      Left Right    Full Full       Extraocular Movement      Right Left    Full, Ortho Full, Ortho       Neuro/Psych    Oriented x3: Yes   Mood/Affect: Normal       Dilation    Both eyes: 1.0% Mydriacyl, 2.5% Phenylephrine @ 1:41 PM        Slit Lamp and Fundus Exam    Slit Lamp Exam      Right Left   Lids/Lashes Dermatochalasis - upper lid Dermatochalasis - upper lid   Conjunctiva/Sclera White and quiet White and quiet   Cornea Arcus, 1+ Punctate epithelial erosions, trace Descemet's folds Arcus, trace Punctate epithelial erosions, Well healed cataract wounds, trace Descemet's folds   Anterior Chamber deep and clear deep and clear   Iris Round and dilated, No NVI Round and dilated, No NVI   Lens 3+ Nuclear sclerosis with brunescence, 3+Cortical cataract PC IOL in good position with PC opening   Vitreous Vitreous syneresis, Posterior vitreous detachment Vitreous syneresis, Posterior vitreous detachment       Fundus Exam      Right Left   Disc Compact, Peripapillary atrophy, Pink and Sharp Compact, mild tilt, temporal PPA   C/D Ratio 0.1 0.2   Macula Flat, Blunted foveal reflex, RPE mottling and clumping, focal atrophy nasal to disc, Drusen, no heme Flat, good foveal reflex, fine drusen, mild Retinal pigment epithelial mottling, No heme or edema   Vessels Vascular attenuation, Tortuous Vascular attenuation, Tortuous, AV crossing changes, isolated sclerotic vessel at 0600   Periphery Attached, No heme   Attached, No heme, peripheral cystoid degeneration          IMAGING AND PROCEDURES  Imaging and Procedures for @TODAY @  OCT, Retina - OU -  Both Eyes       Right Eye Quality was good. Central Foveal Thickness: 290. Progression has been stable. Findings include normal foveal contour, no SRF, no IRF, pigment epithelial detachment, outer retinal atrophy, retinal drusen .   Left Eye Quality was good. Central Foveal Thickness: 278. Progression has been stable. Findings include normal foveal contour, no SRF, no IRF, retinal drusen .   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 CME - cystoid macular edema. PED - pigment epithelial detachment. IRF - intraretinal fluid. SRF - subretinal fluid. EZ - ellipsoid zone. ERM - epiretinal membrane. ORA - outer retinal atrophy. ORT - outer retinal tubulation. SRHM - subretinal hyper-reflective material                 ASSESSMENT/PLAN:    ICD-10-CM   1. Diabetes mellitus type 2 without retinopathy ([place])  E11.9   2. Intermediate stage nonexudative age-related macular degeneration of both eyes  H35.3132   3. Retinal edema  H35.81 OCT, Retina - OU - Both Eyes  4. Pseudophakia of left eye  Z96.1   5. 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 Essential hypertension  I10   7. Combined forms of age-related cataract of right eye  H25.811   8. Hypertensive retinopathy of both eyes  H35.033     1. Diabetes mellitus, type 2 without retinopathy, OU  - The incidence, risk factors for progression, natural history and treatment options for diabetic retinopathy  were discussed with patient.    - The need for close monitoring of blood glucose, blood pressure, and serum lipids, avoiding cigarette or any type of tobacco, and the need for long term  follow up was also discussed with patient.  - monitor  2,3. Age related macular degeneration, non-exudative, both eyes -- stable  - The incidence, anatomy, and pathology of dry AMD, risk of progression, and the AREDS and AREDS 2 study including smoking risks discussed with patient.  - Recommend amsler grid monitoring  - AREDS 2 sample given  - f/u 6-9 months  4. Myopic degeneration OD  - mild low lying PED w/ overlying ORA nasal to fovea  - stable without IRF/SRF  - monitor  5,6. Hypertensive retinopathy OU  - discussed importance of tight BP control  - monitor  7. Pseudophakia OS  - s/p CE/IOL and YAG cap OS  - beautiful surgeries, doing well  - surgery done around 2017, Dr. Caleen Essex. [facility] in [place]  - monitor  8. Mixed cataract OD  - The symptoms of cataract, surgical options, and treatments and risks were discussed with patient.  - discussed diagnosis and progression  - approaching visual significance  - evaluated by Dr[facility] Gala Romney at [facility] -- monitoring for now   Ophthalmic Meds Ordered this visit:  No orders of the defined types were placed in this encounter.      Return for f/u 6-9 months, exu ARMD OU, DFE, OCT.  There are no Patient Instructions on file for this visit.   Explained the diagnoses, plan, and follow up with the patient and they expressed understanding.  Patient expressed understanding of the importance of proper follow up care.   This document serves as a record of services personally performed by Aaron Edelman  Antony Haste, MD, PhD. It was created on their behalf by Leeann Must, [place], a certified ophthalmic assistant. The creation of this record is the provider's dictation and/or activities during the visit.    Electronically signed by: Leeann Must, COA @TODAY @ 9:13 PM   This document serves as a record of services personally performed by Gardiner Sleeper, MD, PhD. It was created on their behalf by Ernest Mallick, OA, an ophthalmic  assistant. The creation of this record is the provider's dictation and/or activities during the visit.    Electronically signed by: Ernest Mallick, OA 11.30.2020 9:13 PM    Gardiner Sleeper, M.D., Ph.D. Diseases & Surgery of the Retina and Vitreous [facility] [place]   I have reviewed the above documentation for accuracy and completeness, and I agree with the above. Gardiner Sleeper, M.D., Ph.D. 02/14/19 9:13 PM     Abbreviations: M myopia (nearsighted); A astigmatism; H hyperopia (farsighted); P presbyopia; Mrx spectacle prescription;  CTL contact lenses; OD right eye; OS left eye; OU both eyes  XT exotropia; ET esotropia; PEK punctate epithelial keratitis; PEE punctate epithelial erosions; DES dry eye syndrome; MGD meibomian gland dysfunction; ATs artificial tears; PFAT's preservative free artificial tears; [place] nuclear sclerotic cataract; PSC posterior subcapsular cataract; ERM epi-retinal membrane; PVD posterior vitreous detachment; RD retinal detachment; DM diabetes mellitus; DR diabetic retinopathy; NPDR non-proliferative diabetic retinopathy; PDR proliferative diabetic retinopathy; CSME clinically significant macular edema; DME diabetic macular edema; dbh dot blot hemorrhages; CWS cotton wool spot; POAG primary open angle glaucoma; C/D cup-to-disc ratio; HVF humphrey visual field; GVF goldmann visual field; OCT optical coherence tomography; IOP intraocular pressure; BRVO Branch retinal vein occlusion; CRVO central retinal vein occlusion; CRAO central retinal artery occlusion; BRAO branch retinal artery occlusion; RT retinal tear; SB scleral buckle; PPV pars plana vitrectomy; VH Vitreous hemorrhage; PRP panretinal laser photocoagulation; IVK intravitreal kenalog; VMT vitreomacular traction; MH Macular hole;  NVD neovascularization of the disc; NVE neovascularization elsewhere; AREDS age related eye disease study; ARMD age related macular degeneration; POAG primary open angle  glaucoma; EBMD epithelial/anterior basement membrane dystrophy; ACIOL anterior chamber intraocular lens; IOL intraocular lens; PCIOL posterior chamber intraocular lens; Phaco/IOL phacoemulsification with intraocular lens placement; [place] photorefractive keratectomy; LASIK laser assisted in situ keratomileusis; HTN hypertension; DM diabetes mellitus; COPD chronic obstructive pulmonary disease

## 2019-02-14 ENCOUNTER — Other Ambulatory Visit: Payer: Self-pay

## 2019-02-14 ENCOUNTER — Ambulatory Visit (INDEPENDENT_AMBULATORY_CARE_PROVIDER_SITE_OTHER): Payer: PPO | Admitting: Ophthalmology

## 2019-02-14 ENCOUNTER — Encounter (INDEPENDENT_AMBULATORY_CARE_PROVIDER_SITE_OTHER): Payer: Self-pay | Admitting: Ophthalmology

## 2019-02-14 DIAGNOSIS — H25811 Combined forms of age-related cataract, right eye: Secondary | ICD-10-CM | POA: Diagnosis not present

## 2019-02-14 DIAGNOSIS — Z961 Presence of intraocular lens: Secondary | ICD-10-CM | POA: Diagnosis not present

## 2019-02-14 DIAGNOSIS — H4421 Degenerative myopia, right eye: Secondary | ICD-10-CM | POA: Diagnosis not present

## 2019-02-14 DIAGNOSIS — H353132 Nonexudative age-related macular degeneration, bilateral, intermediate dry stage: Secondary | ICD-10-CM

## 2019-02-14 DIAGNOSIS — H3581 Retinal edema: Secondary | ICD-10-CM | POA: Diagnosis not present

## 2019-02-14 DIAGNOSIS — I10 Essential (primary) hypertension: Secondary | ICD-10-CM

## 2019-02-14 DIAGNOSIS — E119 Type 2 diabetes mellitus without complications: Secondary | ICD-10-CM | POA: Diagnosis not present

## 2019-02-14 DIAGNOSIS — H35033 Hypertensive retinopathy, bilateral: Secondary | ICD-10-CM | POA: Diagnosis not present

## 2019-03-02 DIAGNOSIS — N3281 Overactive bladder: Secondary | ICD-10-CM | POA: Diagnosis not present

## 2019-03-02 DIAGNOSIS — E1169 Type 2 diabetes mellitus with other specified complication: Secondary | ICD-10-CM | POA: Diagnosis not present

## 2019-03-02 DIAGNOSIS — F411 Generalized anxiety disorder: Secondary | ICD-10-CM | POA: Diagnosis not present

## 2019-03-02 DIAGNOSIS — I10 Essential (primary) hypertension: Secondary | ICD-10-CM | POA: Diagnosis not present

## 2019-03-02 DIAGNOSIS — E785 Hyperlipidemia, unspecified: Secondary | ICD-10-CM | POA: Diagnosis not present

## 2019-03-02 DIAGNOSIS — N184 Chronic kidney disease, stage 4 (severe): Secondary | ICD-10-CM | POA: Diagnosis not present

## 2019-03-02 DIAGNOSIS — Z95820 Peripheral vascular angioplasty status with implants and grafts: Secondary | ICD-10-CM | POA: Diagnosis not present

## 2019-03-09 DIAGNOSIS — I10 Essential (primary) hypertension: Secondary | ICD-10-CM | POA: Diagnosis not present

## 2019-03-09 DIAGNOSIS — F3341 Major depressive disorder, recurrent, in partial remission: Secondary | ICD-10-CM | POA: Diagnosis not present

## 2019-03-09 DIAGNOSIS — E1169 Type 2 diabetes mellitus with other specified complication: Secondary | ICD-10-CM | POA: Diagnosis not present

## 2019-03-09 DIAGNOSIS — E785 Hyperlipidemia, unspecified: Secondary | ICD-10-CM | POA: Diagnosis not present

## 2019-03-09 DIAGNOSIS — I2510 Atherosclerotic heart disease of native coronary artery without angina pectoris: Secondary | ICD-10-CM | POA: Diagnosis not present

## 2019-03-09 DIAGNOSIS — N184 Chronic kidney disease, stage 4 (severe): Secondary | ICD-10-CM | POA: Diagnosis not present

## 2019-03-22 DIAGNOSIS — N183 Chronic kidney disease, stage 3 unspecified: Secondary | ICD-10-CM | POA: Diagnosis not present

## 2019-03-29 DIAGNOSIS — I2510 Atherosclerotic heart disease of native coronary artery without angina pectoris: Secondary | ICD-10-CM | POA: Diagnosis not present

## 2019-03-29 DIAGNOSIS — E1122 Type 2 diabetes mellitus with diabetic chronic kidney disease: Secondary | ICD-10-CM | POA: Diagnosis not present

## 2019-03-29 DIAGNOSIS — R001 Bradycardia, unspecified: Secondary | ICD-10-CM | POA: Diagnosis not present

## 2019-03-29 DIAGNOSIS — N1832 Chronic kidney disease, stage 3b: Secondary | ICD-10-CM | POA: Diagnosis not present

## 2019-03-29 DIAGNOSIS — I129 Hypertensive chronic kidney disease with stage 1 through stage 4 chronic kidney disease, or unspecified chronic kidney disease: Secondary | ICD-10-CM | POA: Diagnosis not present

## 2019-03-30 DIAGNOSIS — H353132 Nonexudative age-related macular degeneration, bilateral, intermediate dry stage: Secondary | ICD-10-CM | POA: Diagnosis not present

## 2019-03-30 DIAGNOSIS — H25811 Combined forms of age-related cataract, right eye: Secondary | ICD-10-CM | POA: Diagnosis not present

## 2019-03-30 DIAGNOSIS — Z961 Presence of intraocular lens: Secondary | ICD-10-CM | POA: Diagnosis not present

## 2019-03-30 DIAGNOSIS — E119 Type 2 diabetes mellitus without complications: Secondary | ICD-10-CM | POA: Diagnosis not present

## 2019-06-06 DIAGNOSIS — I2510 Atherosclerotic heart disease of native coronary artery without angina pectoris: Secondary | ICD-10-CM | POA: Diagnosis not present

## 2019-06-06 DIAGNOSIS — N184 Chronic kidney disease, stage 4 (severe): Secondary | ICD-10-CM | POA: Diagnosis not present

## 2019-06-06 DIAGNOSIS — I10 Essential (primary) hypertension: Secondary | ICD-10-CM | POA: Diagnosis not present

## 2019-06-06 DIAGNOSIS — F3341 Major depressive disorder, recurrent, in partial remission: Secondary | ICD-10-CM | POA: Diagnosis not present

## 2019-06-06 DIAGNOSIS — E785 Hyperlipidemia, unspecified: Secondary | ICD-10-CM | POA: Diagnosis not present

## 2019-06-06 DIAGNOSIS — E1169 Type 2 diabetes mellitus with other specified complication: Secondary | ICD-10-CM | POA: Diagnosis not present

## 2019-09-01 DIAGNOSIS — K219 Gastro-esophageal reflux disease without esophagitis: Secondary | ICD-10-CM | POA: Diagnosis not present

## 2019-09-01 DIAGNOSIS — Z0000 Encounter for general adult medical examination without abnormal findings: Secondary | ICD-10-CM | POA: Diagnosis not present

## 2019-09-01 DIAGNOSIS — I10 Essential (primary) hypertension: Secondary | ICD-10-CM | POA: Diagnosis not present

## 2019-09-01 DIAGNOSIS — Z9861 Coronary angioplasty status: Secondary | ICD-10-CM | POA: Diagnosis not present

## 2019-09-01 DIAGNOSIS — N1832 Chronic kidney disease, stage 3b: Secondary | ICD-10-CM | POA: Diagnosis not present

## 2019-09-01 DIAGNOSIS — I2510 Atherosclerotic heart disease of native coronary artery without angina pectoris: Secondary | ICD-10-CM | POA: Diagnosis not present

## 2019-09-01 DIAGNOSIS — K5900 Constipation, unspecified: Secondary | ICD-10-CM | POA: Diagnosis not present

## 2019-09-01 DIAGNOSIS — F3341 Major depressive disorder, recurrent, in partial remission: Secondary | ICD-10-CM | POA: Diagnosis not present

## 2019-09-01 DIAGNOSIS — E2839 Other primary ovarian failure: Secondary | ICD-10-CM | POA: Diagnosis not present

## 2019-09-01 DIAGNOSIS — E1169 Type 2 diabetes mellitus with other specified complication: Secondary | ICD-10-CM | POA: Diagnosis not present

## 2019-09-01 DIAGNOSIS — E785 Hyperlipidemia, unspecified: Secondary | ICD-10-CM | POA: Diagnosis not present

## 2019-09-01 DIAGNOSIS — R21 Rash and other nonspecific skin eruption: Secondary | ICD-10-CM | POA: Diagnosis not present

## 2019-09-05 ENCOUNTER — Other Ambulatory Visit: Payer: Self-pay | Admitting: Family Medicine

## 2019-09-05 DIAGNOSIS — E2839 Other primary ovarian failure: Secondary | ICD-10-CM

## 2019-09-05 DIAGNOSIS — Z1231 Encounter for screening mammogram for malignant neoplasm of breast: Secondary | ICD-10-CM

## 2019-09-14 ENCOUNTER — Ambulatory Visit: Payer: PPO | Admitting: Cardiovascular Disease

## 2019-09-14 ENCOUNTER — Other Ambulatory Visit: Payer: Self-pay

## 2019-09-14 ENCOUNTER — Encounter: Payer: Self-pay | Admitting: Cardiovascular Disease

## 2019-09-14 VITALS — BP 110/56 | HR 52 | Ht 59.0 in | Wt 125.4 lb

## 2019-09-14 DIAGNOSIS — I10 Essential (primary) hypertension: Secondary | ICD-10-CM | POA: Diagnosis not present

## 2019-09-14 DIAGNOSIS — E782 Mixed hyperlipidemia: Secondary | ICD-10-CM | POA: Diagnosis not present

## 2019-09-14 DIAGNOSIS — I2510 Atherosclerotic heart disease of native coronary artery without angina pectoris: Secondary | ICD-10-CM | POA: Diagnosis not present

## 2019-09-14 NOTE — Assessment & Plan Note (Signed)
history of essential hypertension a blood pressure measured today 110/56.  She is on enalapril and nifedipine.

## 2019-09-14 NOTE — Patient Instructions (Signed)
Medication Instructions:  NO CHANGE *If you need a refill on your cardiac medications before your next appointment, please call your pharmacy*   Lab Work: If you have labs (blood work) drawn today and your tests are completely normal, you will receive your results only by: Marland Kitchen MyChart Message (if you have MyChart) OR . A paper copy in the mail If you have any lab test that is abnormal or we need to change your treatment, we will call you to review the results.   Follow-Up: At [facility], you and your health needs are our priority.  As part of our continuing mission to provide you with exceptional heart care, we have created designated Provider Care Teams.  These Care Teams include your primary Cardiologist (physician) and Advanced Practice Providers (APPs -  Physician Assistants and Nurse Practitioners) who all work together to provide you with the care you need, when you need it.  We recommend signing up for the patient portal called "MyChart".  Sign up information is provided on this After Visit Summary.  MyChart is used to connect with patients for Virtual Visits (Telemedicine).  Patients are able to view lab/test results, encounter notes, upcoming appointments, etc.  Non-urgent messages can be sent to your provider as well.   To learn more about what you can do with MyChart, go to [email or web address].    Your next appointment:   12 month(s)  The format for your next appointment:   In Person  Provider:   Quay Burow, MD

## 2019-09-14 NOTE — Progress Notes (Signed)
09/14/2019 Kimberly Rivers   07-02-1950  [id number]  Primary Physician Patient, No Pcp Per Primary Cardiologist: Kimberly Harp MD Kimberly Rivers, [place], [place]  HPI:  Kimberly Rivers is a 69 y.o. mildly overweight single Caucasian female with no children who recently relocated from the [place] to [facility] because of retirement and cost of living.  She was referred by Dr. Lynelle Rivers, her PCP, to be established in my practice for ongoing cardiovascular care.  I last saw her in the office 01/15/2018. 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  Risk factors include treated hypertension, diabetes and lipidemia.  Her mother did have a CABG.  She is never had a heart attack or stroke.  She had stenting at [facility] before 2010 with a Taxus express drug-eluting stent and then stenting 2/29/2010 with 2 Endeavor stents in her LAD.  She had a normal 2D echo and Myoview stress test by her cardiologist in [place] 08/26/2017.   Since I saw her a year and a half ago she continues to do well.  She denies chest pain or shortness of breath.    Current Meds  Medication Sig  . aspirin EC 81 MG tablet Take 81 mg by mouth daily.  . Carboxymethylcellulose Sodium (EYE DROPS OP) Apply to eye. Theratears Drops as needed in the mornings.  . Carboxymethylcellulose Sodium (EYE DROPS OP) Apply to eye. Refresh Drops as needed before bed  . citalopram (CELEXA) 40 MG tablet Take 40 mg by mouth daily.  Marland Kitchen CRANBERRY PO Take 4,200 capsules by mouth daily.  . enalapril (VASOTEC) 10 MG tablet Take 10 mg by mouth daily.  . Ergocalciferol (VITAMIN D2 PO) Take by mouth. 2-3 times a week  . Ferrous Sulfate (IRON) 325 (65 Fe) MG TABS Take 1 tablet by mouth daily.  Marland Kitchen gabapentin (NEURONTIN) 300 MG capsule Take 300 mg by mouth at bedtime.   . Lactobacillus (PROBIOTIC ACIDOPHILUS PO) Take by mouth.  . Multiple Vitamins-Minerals (PRESERVISION AREDS PO) Take by mouth. Uses 2 times daily  . NIFEdipine  (ADALAT CC) 60 MG 24 hr tablet Take 60 mg by mouth daily.  [name] ULTRA test strip USE 1 STRIP 1 2 TIMES DAILY AS DIRECTED 50  . oxybutynin (DITROPAN-XL) 5 MG 24 hr tablet Take 5 mg by mouth at bedtime.  . ranitidine (ZANTAC) 150 MG tablet Take 150 mg by mouth as directed.  . repaglinide (PRANDIN) 0.5 MG tablet   . simvastatin (ZOCOR) 10 MG tablet Take 10 mg by mouth daily.  . vitamin C (ASCORBIC ACID) 500 MG tablet Take 500 mg by mouth daily.     Allergies  Allergen Reactions  . Clindamycin/Lincomycin   . Doxycycline   . Penicillins     Social History   Socioeconomic History  . Marital status: Single    Spouse name: Not on file  . Number of children: Not on file  . Years of education: Not on file  . Highest education level: Not on file  Occupational History  . Not on file  Tobacco Use  . Smoking status: Never Smoker  . Smokeless tobacco: Never Used  Vaping Use  . Vaping Use: Never used  Substance and Sexual Activity  . Alcohol use: Not on file  . Drug use: Not on file  . Sexual activity: Not on file  Other Topics Concern  . Not on file  Social History Narrative  . Not on file  Social Determinants of Health   Financial Resource Strain:   . Difficulty of Paying Living Expenses:   Food Insecurity:   . Worried About Charity fundraiser in the Last Year:   . Arboriculturist in the Last Year:   Transportation Needs:   . Film/video editor (Medical):   Marland Kitchen Lack of Transportation (Non-Medical):   Physical Activity:   . Days of Exercise per Week:   . Minutes of Exercise per Session:   Stress:   . Feeling of Stress :   Social Connections:   . Frequency of Communication with Friends and Family:   . Frequency of Social Gatherings with Friends and Family:   . Attends Religious Services:   . Active Member of Clubs or Organizations:   . Attends Archivist Meetings:   Marland Kitchen Marital Status:   Intimate Partner Violence:   . Fear of Current or Ex-Partner:     . Emotionally Abused:   Marland Kitchen Physically Abused:   . Sexually Abused:      Review of Systems: General: negative for chills, fever, night sweats or weight changes.  Cardiovascular: negative for chest pain, dyspnea on exertion, edema, orthopnea, palpitations, paroxysmal nocturnal dyspnea or shortness of breath Dermatological: negative for rash Respiratory: negative for cough or wheezing Urologic: negative for hematuria Abdominal: negative for nausea, vomiting, diarrhea, bright red blood per rectum, melena, or hematemesis Neurologic: negative for visual changes, syncope, or dizziness All other systems reviewed and are otherwise negative except as noted above.    Blood pressure (!) 110/56, pulse (!) 52, height 4\' 11"  (1.499 m), weight 125 lb 6.4 oz (56.9 kg), SpO2 97 %.  General appearance: alert and no distress Neck: no adenopathy, no carotid bruit, no JVD, supple, symmetrical, trachea midline and thyroid not enlarged, symmetric, no tenderness/mass/nodules Lungs: clear to auscultation bilaterally Heart: Soft outflow tract murmur Extremities: extremities normal, atraumatic, no cyanosis or edema Pulses: 2+ and symmetric Skin: Skin color, texture, turgor normal. No rashes or lesions Neurologic: Alert and oriented X 3, normal strength and tone. Normal symmetric reflexes. Normal coordination and gait  EKG sinus bradycardia 52 with right bundle branch block.  I personally reviewed this EKG.  ASSESSMENT AND PLAN:   Essential hypertension history of essential hypertension a blood pressure measured today 110/56.  She is on enalapril and nifedipine.   Hyperlipidemia History of hyperlipidemia on statin therapy with lipid profile performed 09/01/2019 revealed a total cholesterol of 190, LDL of 75 and HDL of 103.  Coronary artery disease History of CAD status post stenting at [facility] before 2010 with a Taxus express drug-eluting stent and stenting 2/29/2010 with 2 Endeavor stents to  her LAD.  She had a normal 2D echo Myoview stress test in [place] 08/26/2017 performed by her cardiologist.  She denies chest pain or shortness of breath.      Kimberly Harp MD FACP,FACC,FAHA, [facility] 09/14/2019 9:34 AM

## 2019-09-14 NOTE — Assessment & Plan Note (Signed)
History of CAD status post stenting at [facility] before 2010 with a Taxus express drug-eluting stent and stenting 2/29/2010 with 2 Endeavor stents to her LAD.  She had a normal 2D echo Myoview stress test in [place] 08/26/2017 performed by her cardiologist.  She denies chest pain or shortness of breath.

## 2019-09-14 NOTE — Assessment & Plan Note (Signed)
History of hyperlipidemia on statin therapy with lipid profile performed 09/01/2019 revealed a total cholesterol of 190, LDL of 75 and HDL of 103.

## 2019-09-20 DIAGNOSIS — N1832 Chronic kidney disease, stage 3b: Secondary | ICD-10-CM | POA: Diagnosis not present

## 2019-09-28 ENCOUNTER — Telehealth: Payer: Self-pay | Admitting: Cardiovascular Disease

## 2019-09-28 DIAGNOSIS — E1122 Type 2 diabetes mellitus with diabetic chronic kidney disease: Secondary | ICD-10-CM | POA: Diagnosis not present

## 2019-09-28 DIAGNOSIS — N1831 Chronic kidney disease, stage 3a: Secondary | ICD-10-CM | POA: Diagnosis not present

## 2019-09-28 DIAGNOSIS — R001 Bradycardia, unspecified: Secondary | ICD-10-CM | POA: Diagnosis not present

## 2019-09-28 DIAGNOSIS — I2510 Atherosclerotic heart disease of native coronary artery without angina pectoris: Secondary | ICD-10-CM | POA: Diagnosis not present

## 2019-09-28 DIAGNOSIS — I129 Hypertensive chronic kidney disease with stage 1 through stage 4 chronic kidney disease, or unspecified chronic kidney disease: Secondary | ICD-10-CM | POA: Diagnosis not present

## 2019-09-28 NOTE — Telephone Encounter (Signed)
Pt c/o medication issue:  1. Name of Medication: enalapril (VASOTEC) 10 MG tablet  2. How are you currently taking this medication (dosage and times per day)? 1 tablet a day  3. Are you having a reaction (difficulty breathing--STAT)? no  4. What is your medication issue? Patient states her Kidney doctor wants her to discontinue the medication, because her BP was 104/52. She would like Dr. Kennon Holter opinion on whether this would be okay.

## 2019-09-28 NOTE — Telephone Encounter (Signed)
Pt states that she went to see her kidney doctor today who suggested that she stop taking her  enalapril (VASOTEC) 10 MG tablet as her BP was 104/52. The patient denies any symptoms and reports feeling fine. She would like Dr. Gwenlyn Found to advise on if she should continue taking this medication. Will route to JB, MD for advisement.

## 2019-10-02 NOTE — Telephone Encounter (Signed)
That's fine with me as long as she has a way to monitor her BP at home

## 2019-10-03 NOTE — Telephone Encounter (Signed)
Spoke with pt, aware of dr berry's recommendations. She has a way to check her bp at home.

## 2019-10-06 DIAGNOSIS — I2510 Atherosclerotic heart disease of native coronary artery without angina pectoris: Secondary | ICD-10-CM | POA: Diagnosis not present

## 2019-10-06 DIAGNOSIS — F3341 Major depressive disorder, recurrent, in partial remission: Secondary | ICD-10-CM | POA: Diagnosis not present

## 2019-10-06 DIAGNOSIS — E785 Hyperlipidemia, unspecified: Secondary | ICD-10-CM | POA: Diagnosis not present

## 2019-10-06 DIAGNOSIS — E1169 Type 2 diabetes mellitus with other specified complication: Secondary | ICD-10-CM | POA: Diagnosis not present

## 2019-10-06 DIAGNOSIS — N184 Chronic kidney disease, stage 4 (severe): Secondary | ICD-10-CM | POA: Diagnosis not present

## 2019-10-06 DIAGNOSIS — I10 Essential (primary) hypertension: Secondary | ICD-10-CM | POA: Diagnosis not present

## 2019-10-10 MED ORDER — NIFEDIPINE ER OSMOTIC RELEASE 90 MG PO TB24
90.0000 mg | ORAL_TABLET | Freq: Every day | ORAL | 6 refills | Status: DC
Start: 2019-10-10 — End: 2019-10-12

## 2019-10-12 ENCOUNTER — Telehealth: Payer: Self-pay | Admitting: Cardiovascular Disease

## 2019-10-12 MED ORDER — NIFEDIPINE ER OSMOTIC RELEASE 60 MG PO TB24
60.0000 mg | ORAL_TABLET | Freq: Every day | ORAL | 3 refills | Status: AC
Start: 2019-10-12 — End: ?

## 2019-10-12 MED ORDER — ENALAPRIL MALEATE 5 MG PO TABS: 5.0000 mg | ORAL_TABLET | Freq: Every day | ORAL | 6 refills | Status: DC

## 2019-10-12 NOTE — Telephone Encounter (Signed)
Question about BP (enalapril and nifedipine) answered differently by myself and Dr. Gwenlyn Found.  Patient nephrologist d/c enalapril due to a low BP reading in his office (034 systolic).  Was not a discontinuation due to poor renal function.    Advised patient to continue with nifedipine xl 60 mg daily and add back enalapril, but at 5 mg instead of 10 mg.  She will continue to monitor home BP readings and call in 1-2 weeks if any further concerns.

## 2019-10-27 DIAGNOSIS — K5904 Chronic idiopathic constipation: Secondary | ICD-10-CM | POA: Diagnosis not present

## 2019-11-10 NOTE — Progress Notes (Signed)
[facility] Note  11/14/2019     CHIEF COMPLAINT Patient presents for Retina Follow Up   HISTORY OF PRESENT ILLNESS: Kimberly Rivers is a 69 y.o. female who presents to the clinic today for:   HPI    Retina Follow Up    Patient presents with  Dry AMD.  In both eyes.  Duration of 9 months.  Since onset it is stable.  I, the attending physician,  performed the HPI with the patient and updated documentation appropriately.          Comments    9 month follow up ARMD OU- Doing well.  Vision appears stable. Using TheraTears qam and Refresh qhs OU. BS: 110 this am fasting A1C: 6.0       Last edited by Bernarda Caffey, MD on 11/14/2019  4:52 PM. (History)    pt states no change in vision, she is still taking AREDS 2, pt has an appt with Dr. Eulas Post in Dec/Jan  Referring physician: Lonia Skinner, [place],  [place] [id number]  HISTORICAL INFORMATION:   Selected notes from the MEDICAL RECORD NUMBER Referred by [facility] for DM exam LEE: 06.17.19 (Dr. Nada Libman, MD) [BCVA: OD: 20/25 OS: 20/30--]  Ocular Hx-non-exu ARMD, cataract OS, pseudo OD PMH-DM, CKD, HTN    CURRENT MEDICATIONS: Current Outpatient Medications (Ophthalmic Drugs)  Medication Sig  . Carboxymethylcellulose Sodium (EYE DROPS OP) Apply to eye. Theratears Drops as needed in the mornings.  . Carboxymethylcellulose Sodium (EYE DROPS OP) Apply to eye. Refresh Drops as needed before bed   No current facility-administered medications for this visit. (Ophthalmic Drugs)   Current Outpatient Medications (Other)  Medication Sig  . aspirin EC 81 MG tablet Take 81 mg by mouth daily.  . citalopram (CELEXA) 40 MG tablet Take 40 mg by mouth daily.  Marland Kitchen CRANBERRY PO Take 4,200 capsules by mouth daily.  . enalapril (VASOTEC) 5 MG tablet Take 1 tablet (5 mg total) by mouth daily.  . Ergocalciferol (VITAMIN D2 PO) Take by mouth. 2-3 times a week  . Ferrous  Sulfate (IRON) 325 (65 Fe) MG TABS Take 1 tablet by mouth daily.  Marland Kitchen gabapentin (NEURONTIN) 300 MG capsule Take 300 mg by mouth at bedtime.   . Lactobacillus (PROBIOTIC ACIDOPHILUS PO) Take by mouth.  . Multiple Vitamins-Minerals (PRESERVISION AREDS PO) Take by mouth. Uses 2 times daily  . NIFEdipine (PROCARDIA XL/NIFEDICAL XL) 60 MG 24 hr tablet Take 1 tablet (60 mg total) by mouth daily.  [name] ULTRA test strip USE 1 STRIP 1 2 TIMES DAILY AS DIRECTED 50  . oxybutynin (DITROPAN-XL) 5 MG 24 hr tablet Take 5 mg by mouth at bedtime.  . ranitidine (ZANTAC) 150 MG tablet Take 150 mg by mouth as directed.  . repaglinide (PRANDIN) 0.5 MG tablet   . simvastatin (ZOCOR) 10 MG tablet Take 10 mg by mouth daily.  . vitamin C (ASCORBIC ACID) 500 MG tablet Take 500 mg by mouth daily.   No current facility-administered medications for this visit. (Other)      REVIEW OF SYSTEMS: ROS    Positive for: Endocrine, Cardiovascular, Eyes   Negative for: Constitutional, Gastrointestinal, Neurological, Skin, Genitourinary, Musculoskeletal, HENT, Respiratory, Psychiatric, Allergic/Imm, Heme/Lymph   Last edited by Leonie Douglas, COA on 11/14/2019  1:06 PM. (History)       ALLERGIES Allergies  Allergen Reactions  . Clindamycin/Lincomycin   . Doxycycline   . Penicillins  PAST MEDICAL HISTORY Past Medical History:  Diagnosis Date  . Cataract    OD  . Chronic kidney disease   . Diabetes mellitus without complication ([place])   . Hypertension   . Hypertensive retinopathy    OU  . Macular degeneration    Dry OU   Past Surgical History:  Procedure Laterality Date  . CATARACT EXTRACTION Left   . EYE SURGERY Left    Cat Sx    FAMILY HISTORY Family History  Problem Relation Age of Onset  . Hypertension Mother   . Heart disease Mother   . Diabetes Mother   . [place] disease Father   . Heart disease Maternal Grandmother   . Heart disease Maternal Grandfather   . Kidney failure Maternal  Grandfather     SOCIAL HISTORY Social History   Tobacco Use  . Smoking status: Never Smoker  . Smokeless tobacco: Never Used  Vaping Use  . Vaping Use: Never used  Substance Use Topics  . Alcohol use: Not on file  . Drug use: Not on file         OPHTHALMIC EXAM:  Base Eye Exam    Visual Acuity (Snellen - Linear)      Right Left   Dist cc 20/40 20/40   Dist ph cc 20/40 +2 NI   Correction: Glasses       Tonometry (Tonopen, 1:20 PM)      Right Left   Pressure 14 14       Pupils      Dark Light Shape React APD   Right 3 2 Round Brisk None   Left 3 2 Round Brisk None       Visual Fields (Counting fingers)      Left Right    Full Full       Extraocular Movement      Right Left    Full Full       Neuro/Psych    Oriented x3: Yes   Mood/Affect: Normal       Dilation    Both eyes: 1.0% Mydriacyl, 2.5% Phenylephrine @ 1:20 PM        Slit Lamp and Fundus Exam    Slit Lamp Exam      Right Left   Lids/Lashes Dermatochalasis - upper lid Dermatochalasis - upper lid   Conjunctiva/Sclera White and quiet White and quiet   Cornea Arcus, 1+ Punctate epithelial erosions Arcus, trace Punctate epithelial erosions, Well healed cataract wounds   Anterior Chamber deep and clear deep and clear   Iris Round and dilated, No NVI Round and dilated, No NVI   Lens 3+ Nuclear sclerosis with brunescence, 3+Cortical cataract PC IOL in good position with PC opening   Vitreous Vitreous syneresis, Posterior vitreous detachment Vitreous syneresis, Posterior vitreous detachment       Fundus Exam      Right Left   Disc Compact, mild Peripapillary atrophy, Pink and Sharp Compact, mild tilt, temporal PPA   C/D Ratio 0.2 0.3   Macula Flat, Blunted foveal reflex, RPE mottling and clumping, Drusen, focal atrophy nasal to fovea, no heme Flat, good foveal reflex, fine drusen, mild Retinal pigment epithelial mottling, No heme or edema   Vessels Vascular attenuation, Tortuous, mild Copper  wiring, +AV crossing changes Vascular attenuation, Tortuous   Periphery Attached, No heme  Attached, No heme, peripheral cystoid degeneration        Refraction    Wearing Rx      Sphere Cylinder Axis Add  Right -2.00 +0.75 175 +2.75   Left -0.25 +1.00 105 +2.75       Manifest Refraction      Sphere Cylinder Axis Dist VA   Right -2.00 +1.75 180 20/30-1   Left [place] +1.75 135 NI          IMAGING AND PROCEDURES  Imaging and Procedures for @TODAY @  OCT, Retina - OU - Both Eyes       Right Eye Quality was good. Central Foveal Thickness: 292. Progression has been stable. Findings include normal foveal contour, no SRF, no IRF, pigment epithelial detachment, outer retinal atrophy, retinal drusen .   Left Eye Quality was good. Central Foveal Thickness: 274. Progression has been stable. Findings include normal foveal contour, no SRF, no IRF, retinal drusen .   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 CME - cystoid macular edema. PED - pigment epithelial detachment. IRF - intraretinal fluid. SRF - subretinal fluid. EZ - ellipsoid zone. ERM - epiretinal membrane. ORA - outer retinal atrophy. ORT - outer retinal tubulation. SRHM - subretinal hyper-reflective material                 ASSESSMENT/PLAN:    ICD-10-CM   1. Diabetes mellitus type 2 without retinopathy ([place])  E11.9   2. Intermediate stage nonexudative age-related macular degeneration of both eyes  H35.3132   3. Retinal edema  H35.81 OCT, Retina - OU - Both Eyes  4. Pseudophakia of left eye  Z96.1   5. 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 Essential hypertension  I10   7. Combined forms of age-related cataract of right eye  H25.811   8. Hypertensive retinopathy of both eyes  H35.033      1. Diabetes mellitus, type 2 without retinopathy, OU  - stable  - The incidence, risk factors for progression, natural history and treatment options for diabetic retinopathy  were discussed with patient.    - The need for close monitoring of blood glucose, blood pressure, and serum lipids, avoiding cigarette or any type of tobacco, and the need for long term follow up was also discussed with patient.  - monitor  2,3. Age related macular degeneration, non-exudative, both eyes -- stable  - The incidence, anatomy, and pathology of dry AMD, risk of progression, and the AREDS and AREDS 2 study including smoking risks discussed with patient.  - continue amsler grid monitoring  - continue AREDS 2 supplements  - f/u 1 yr sooner prn  4. Myopic degeneration OD  - mild low lying PED w/ overlying ORA nasal to fovea  - stable without IRF/SRF  - monitor  5,6. Hypertensive retinopathy OU  - discussed importance of tight BP control  - monitor  7. Pseudophakia OS  - s/p CE/IOL and YAG cap OS  - beautiful surgeries, doing well  - surgery done around 2017, Dr. Caleen Essex. [facility] in [place]  - monitor  8. Mixed cataract OD  - The symptoms of cataract, surgical options, and treatments and risks were discussed with patient.  - discussed diagnosis and progression  - approaching visual significance  - evaluated by Dr. Gala Romney at [facility] -- monitoring for now   Ophthalmic Meds Ordered this visit:  No orders of the  defined types were placed in this encounter.      Return in about 1 year (around 11/13/2020) for f/u non-exu ARMD OU, DFE, OCT.  There are no Patient Instructions on file for this visit.  This document serves as a record of services personally performed by Gardiner Sleeper, MD, PhD. It was created on their behalf by Leeann Must, [place], an ophthalmic technician. The creation of this record is the provider's dictation and/or activities during the visit.     Electronically signed by: Leeann Must, COA 08.26.2021 4:56 PM   This document serves as a record of services personally performed by Gardiner Sleeper, MD, PhD. It was created on their behalf by San Jetty. Owens Shark, OA an ophthalmic technician. The creation of this record is the provider's dictation and/or activities during the visit.    Electronically signed by: San Jetty. Owens Shark, [place] 08.30.2021 4:56 PM  Gardiner Sleeper, M.D., Ph.D. Diseases & Surgery of the Retina and [place] 11/14/2019   I have reviewed the above documentation for accuracy and completeness, and I agree with the above. Gardiner Sleeper, M.D., Ph.D. 11/14/19 4:56 PM   Abbreviations: M myopia (nearsighted); A astigmatism; H hyperopia (farsighted); P presbyopia; Mrx spectacle prescription;  CTL contact lenses; OD right eye; OS left eye; OU both eyes  XT exotropia; ET esotropia; PEK punctate epithelial keratitis; PEE punctate epithelial erosions; DES dry eye syndrome; MGD meibomian gland dysfunction; ATs artificial tears; PFAT's preservative free artificial tears; [place] nuclear sclerotic cataract; PSC posterior subcapsular cataract; ERM epi-retinal membrane; PVD posterior vitreous detachment; RD retinal detachment; DM diabetes mellitus; DR diabetic retinopathy; NPDR non-proliferative diabetic retinopathy; PDR proliferative diabetic retinopathy; CSME clinically significant macular edema; DME diabetic macular edema; dbh dot blot hemorrhages; CWS cotton wool spot; POAG primary open angle glaucoma; C/D cup-to-disc ratio; HVF humphrey visual field; GVF goldmann visual field; OCT optical coherence tomography; IOP intraocular pressure; BRVO Branch retinal vein occlusion; CRVO central retinal vein occlusion; CRAO central retinal artery occlusion; BRAO branch retinal artery occlusion; RT retinal tear; SB scleral buckle; PPV pars plana vitrectomy; VH Vitreous hemorrhage; PRP panretinal laser photocoagulation; IVK  intravitreal kenalog; VMT vitreomacular traction; MH Macular hole;  NVD neovascularization of the disc; NVE neovascularization elsewhere; AREDS age related eye disease study; ARMD age related macular degeneration; POAG primary open angle glaucoma; EBMD epithelial/anterior basement membrane dystrophy; ACIOL anterior chamber intraocular lens; IOL intraocular lens; PCIOL posterior chamber intraocular lens; Phaco/IOL phacoemulsification with intraocular lens placement; [place] photorefractive keratectomy; LASIK laser assisted in situ keratomileusis; HTN hypertension; DM diabetes mellitus; COPD chronic obstructive pulmonary disease

## 2019-11-14 ENCOUNTER — Ambulatory Visit (INDEPENDENT_AMBULATORY_CARE_PROVIDER_SITE_OTHER): Payer: PPO | Admitting: Ophthalmology

## 2019-11-14 ENCOUNTER — Encounter (INDEPENDENT_AMBULATORY_CARE_PROVIDER_SITE_OTHER): Payer: Self-pay | Admitting: Ophthalmology

## 2019-11-14 ENCOUNTER — Other Ambulatory Visit: Payer: Self-pay

## 2019-11-14 DIAGNOSIS — H353132 Nonexudative age-related macular degeneration, bilateral, intermediate dry stage: Secondary | ICD-10-CM

## 2019-11-14 DIAGNOSIS — H35033 Hypertensive retinopathy, bilateral: Secondary | ICD-10-CM

## 2019-11-14 DIAGNOSIS — H4421 Degenerative myopia, right eye: Secondary | ICD-10-CM | POA: Diagnosis not present

## 2019-11-14 DIAGNOSIS — Z961 Presence of intraocular lens: Secondary | ICD-10-CM

## 2019-11-14 DIAGNOSIS — H3581 Retinal edema: Secondary | ICD-10-CM

## 2019-11-14 DIAGNOSIS — E119 Type 2 diabetes mellitus without complications: Secondary | ICD-10-CM

## 2019-11-14 DIAGNOSIS — I10 Essential (primary) hypertension: Secondary | ICD-10-CM | POA: Diagnosis not present

## 2019-11-14 DIAGNOSIS — H25811 Combined forms of age-related cataract, right eye: Secondary | ICD-10-CM | POA: Diagnosis not present

## 2019-11-15 DIAGNOSIS — F3341 Major depressive disorder, recurrent, in partial remission: Secondary | ICD-10-CM | POA: Diagnosis not present

## 2019-11-15 DIAGNOSIS — E1169 Type 2 diabetes mellitus with other specified complication: Secondary | ICD-10-CM | POA: Diagnosis not present

## 2019-11-15 DIAGNOSIS — I10 Essential (primary) hypertension: Secondary | ICD-10-CM | POA: Diagnosis not present

## 2019-11-15 DIAGNOSIS — I2510 Atherosclerotic heart disease of native coronary artery without angina pectoris: Secondary | ICD-10-CM | POA: Diagnosis not present

## 2019-11-15 DIAGNOSIS — N184 Chronic kidney disease, stage 4 (severe): Secondary | ICD-10-CM | POA: Diagnosis not present

## 2019-11-15 DIAGNOSIS — E785 Hyperlipidemia, unspecified: Secondary | ICD-10-CM | POA: Diagnosis not present

## 2019-11-25 ENCOUNTER — Ambulatory Visit
Admission: RE | Admit: 2019-11-25 | Discharge: 2019-11-25 | Disposition: A | Discharge status: Home / Self Care | Payer: PPO | Source: Ambulatory Visit | Attending: Family Medicine | Admitting: Family Medicine

## 2019-11-25 ENCOUNTER — Other Ambulatory Visit: Payer: Self-pay

## 2019-11-25 DIAGNOSIS — E2839 Other primary ovarian failure: Secondary | ICD-10-CM

## 2019-11-25 DIAGNOSIS — M8589 Other specified disorders of bone density and structure, multiple sites: Secondary | ICD-10-CM | POA: Diagnosis not present

## 2019-11-25 DIAGNOSIS — Z780 Asymptomatic menopausal state: Secondary | ICD-10-CM | POA: Diagnosis not present

## 2019-11-25 DIAGNOSIS — Z1231 Encounter for screening mammogram for malignant neoplasm of breast: Secondary | ICD-10-CM | POA: Diagnosis not present

## 2020-01-04 DIAGNOSIS — E1169 Type 2 diabetes mellitus with other specified complication: Secondary | ICD-10-CM | POA: Diagnosis not present

## 2020-01-04 DIAGNOSIS — I2510 Atherosclerotic heart disease of native coronary artery without angina pectoris: Secondary | ICD-10-CM | POA: Diagnosis not present

## 2020-01-04 DIAGNOSIS — I10 Essential (primary) hypertension: Secondary | ICD-10-CM | POA: Diagnosis not present

## 2020-01-04 DIAGNOSIS — N184 Chronic kidney disease, stage 4 (severe): Secondary | ICD-10-CM | POA: Diagnosis not present

## 2020-01-04 DIAGNOSIS — E785 Hyperlipidemia, unspecified: Secondary | ICD-10-CM | POA: Diagnosis not present

## 2020-01-04 DIAGNOSIS — F3341 Major depressive disorder, recurrent, in partial remission: Secondary | ICD-10-CM | POA: Diagnosis not present

## 2020-01-11 DIAGNOSIS — K5904 Chronic idiopathic constipation: Secondary | ICD-10-CM | POA: Diagnosis not present

## 2020-01-11 DIAGNOSIS — K629 Disease of anus and rectum, unspecified: Secondary | ICD-10-CM | POA: Diagnosis not present

## 2020-01-31 DIAGNOSIS — K5900 Constipation, unspecified: Secondary | ICD-10-CM | POA: Diagnosis not present

## 2020-01-31 DIAGNOSIS — K6289 Other specified diseases of anus and rectum: Secondary | ICD-10-CM | POA: Diagnosis not present

## 2020-02-16 ENCOUNTER — Other Ambulatory Visit: Payer: Self-pay | Admitting: Gastroenterology

## 2020-02-16 DIAGNOSIS — K629 Disease of anus and rectum, unspecified: Secondary | ICD-10-CM

## 2020-02-17 ENCOUNTER — Other Ambulatory Visit: Payer: Self-pay | Admitting: Gastroenterology

## 2020-02-17 DIAGNOSIS — K629 Disease of anus and rectum, unspecified: Secondary | ICD-10-CM

## 2020-02-18 ENCOUNTER — Other Ambulatory Visit: Payer: PPO

## 2020-02-29 ENCOUNTER — Other Ambulatory Visit: Payer: Self-pay

## 2020-03-01 ENCOUNTER — Telehealth: Payer: Self-pay | Admitting: Cardiovascular Disease

## 2020-03-01 ENCOUNTER — Other Ambulatory Visit: Payer: Self-pay

## 2020-03-01 MED ORDER — ENALAPRIL MALEATE 5 MG PO TABS
5.0000 mg | ORAL_TABLET | Freq: Every day | ORAL | 3 refills | Status: DC
Start: 2020-03-01 — End: 2021-03-04

## 2020-03-01 NOTE — Telephone Encounter (Signed)
Prescribing Provider Encounter Provider  Lorretta Harp, MD Beatrix Fetters, RN    Outpatient Medication Detail   Disp Refills Start End   enalapril (VASOTEC) 5 MG tablet 90 tablet 3 03/01/2020    Sig - Route: Take 1 tablet (5 mg total) by mouth daily. - Oral   Sent to pharmacy as: enalapril (VASOTEC) 5 MG tablet   E-Prescribing Status: Receipt confirmed by pharmacy (03/01/2020 9:35 AM EST)    Pharmacy  [place], [place]

## 2020-03-01 NOTE — Telephone Encounter (Signed)
*  STAT* If patient is at the pharmacy, call can be transferred to refill team.   1. Which medications need to be refilled? (please list name of each medication and dose if known) enalapril (VASOTEC) 5 MG tablet  2. Which pharmacy/location (including street and city if local pharmacy) is medication to be sent to? [place], [place]  3. Do they need a 30 day or 90 day supply? 90 day   Patient is out of medication

## 2020-03-01 NOTE — Progress Notes (Signed)
Refill sent to pharmacy. With confirmed receipt at 0935.

## 2020-03-05 DIAGNOSIS — K5904 Chronic idiopathic constipation: Secondary | ICD-10-CM | POA: Diagnosis not present

## 2020-03-05 DIAGNOSIS — E1169 Type 2 diabetes mellitus with other specified complication: Secondary | ICD-10-CM | POA: Diagnosis not present

## 2020-03-05 DIAGNOSIS — I2510 Atherosclerotic heart disease of native coronary artery without angina pectoris: Secondary | ICD-10-CM | POA: Diagnosis not present

## 2020-03-05 DIAGNOSIS — F3341 Major depressive disorder, recurrent, in partial remission: Secondary | ICD-10-CM | POA: Diagnosis not present

## 2020-03-05 DIAGNOSIS — N184 Chronic kidney disease, stage 4 (severe): Secondary | ICD-10-CM | POA: Diagnosis not present

## 2020-03-05 DIAGNOSIS — E785 Hyperlipidemia, unspecified: Secondary | ICD-10-CM | POA: Diagnosis not present

## 2020-03-05 DIAGNOSIS — I10 Essential (primary) hypertension: Secondary | ICD-10-CM | POA: Diagnosis not present

## 2020-03-06 ENCOUNTER — Ambulatory Visit
Admission: RE | Admit: 2020-03-06 | Discharge: 2020-03-06 | Disposition: A | Discharge status: Home / Self Care | Payer: PPO | Source: Ambulatory Visit | Attending: Gastroenterology | Admitting: Gastroenterology

## 2020-03-06 DIAGNOSIS — N8110 Cystocele, unspecified: Secondary | ICD-10-CM | POA: Diagnosis not present

## 2020-03-06 DIAGNOSIS — N816 Rectocele: Secondary | ICD-10-CM | POA: Diagnosis not present

## 2020-03-06 DIAGNOSIS — K629 Disease of anus and rectum, unspecified: Secondary | ICD-10-CM

## 2020-03-06 DIAGNOSIS — K5900 Constipation, unspecified: Secondary | ICD-10-CM | POA: Diagnosis not present

## 2020-03-21 DIAGNOSIS — K219 Gastro-esophageal reflux disease without esophagitis: Secondary | ICD-10-CM | POA: Diagnosis not present

## 2020-03-21 DIAGNOSIS — K5904 Chronic idiopathic constipation: Secondary | ICD-10-CM | POA: Diagnosis not present

## 2020-03-21 DIAGNOSIS — N8189 Other female genital prolapse: Secondary | ICD-10-CM | POA: Diagnosis not present

## 2020-04-12 DIAGNOSIS — K219 Gastro-esophageal reflux disease without esophagitis: Secondary | ICD-10-CM | POA: Diagnosis not present

## 2020-04-12 DIAGNOSIS — I2510 Atherosclerotic heart disease of native coronary artery without angina pectoris: Secondary | ICD-10-CM | POA: Diagnosis not present

## 2020-04-12 DIAGNOSIS — N184 Chronic kidney disease, stage 4 (severe): Secondary | ICD-10-CM | POA: Diagnosis not present

## 2020-04-12 DIAGNOSIS — I10 Essential (primary) hypertension: Secondary | ICD-10-CM | POA: Diagnosis not present

## 2020-04-12 DIAGNOSIS — E1169 Type 2 diabetes mellitus with other specified complication: Secondary | ICD-10-CM | POA: Diagnosis not present

## 2020-04-12 DIAGNOSIS — F3341 Major depressive disorder, recurrent, in partial remission: Secondary | ICD-10-CM | POA: Diagnosis not present

## 2020-04-12 DIAGNOSIS — E785 Hyperlipidemia, unspecified: Secondary | ICD-10-CM | POA: Diagnosis not present

## 2020-05-14 DIAGNOSIS — H353132 Nonexudative age-related macular degeneration, bilateral, intermediate dry stage: Secondary | ICD-10-CM | POA: Diagnosis not present

## 2020-05-14 DIAGNOSIS — E119 Type 2 diabetes mellitus without complications: Secondary | ICD-10-CM | POA: Diagnosis not present

## 2020-05-14 DIAGNOSIS — H25811 Combined forms of age-related cataract, right eye: Secondary | ICD-10-CM | POA: Diagnosis not present

## 2020-05-14 DIAGNOSIS — Z961 Presence of intraocular lens: Secondary | ICD-10-CM | POA: Diagnosis not present

## 2020-05-24 DIAGNOSIS — N184 Chronic kidney disease, stage 4 (severe): Secondary | ICD-10-CM | POA: Diagnosis not present

## 2020-05-24 DIAGNOSIS — I2510 Atherosclerotic heart disease of native coronary artery without angina pectoris: Secondary | ICD-10-CM | POA: Diagnosis not present

## 2020-05-24 DIAGNOSIS — F3341 Major depressive disorder, recurrent, in partial remission: Secondary | ICD-10-CM | POA: Diagnosis not present

## 2020-05-24 DIAGNOSIS — E1169 Type 2 diabetes mellitus with other specified complication: Secondary | ICD-10-CM | POA: Diagnosis not present

## 2020-05-24 DIAGNOSIS — E785 Hyperlipidemia, unspecified: Secondary | ICD-10-CM | POA: Diagnosis not present

## 2020-05-24 DIAGNOSIS — I10 Essential (primary) hypertension: Secondary | ICD-10-CM | POA: Diagnosis not present

## 2020-05-24 DIAGNOSIS — K219 Gastro-esophageal reflux disease without esophagitis: Secondary | ICD-10-CM | POA: Diagnosis not present

## 2020-06-06 DIAGNOSIS — N812 Incomplete uterovaginal prolapse: Secondary | ICD-10-CM | POA: Diagnosis not present

## 2020-06-06 DIAGNOSIS — N8110 Cystocele, unspecified: Secondary | ICD-10-CM | POA: Diagnosis not present

## 2020-08-06 DIAGNOSIS — F3341 Major depressive disorder, recurrent, in partial remission: Secondary | ICD-10-CM | POA: Diagnosis not present

## 2020-08-06 DIAGNOSIS — E785 Hyperlipidemia, unspecified: Secondary | ICD-10-CM | POA: Diagnosis not present

## 2020-08-06 DIAGNOSIS — K219 Gastro-esophageal reflux disease without esophagitis: Secondary | ICD-10-CM | POA: Diagnosis not present

## 2020-08-06 DIAGNOSIS — N184 Chronic kidney disease, stage 4 (severe): Secondary | ICD-10-CM | POA: Diagnosis not present

## 2020-08-06 DIAGNOSIS — E1169 Type 2 diabetes mellitus with other specified complication: Secondary | ICD-10-CM | POA: Diagnosis not present

## 2020-08-06 DIAGNOSIS — I10 Essential (primary) hypertension: Secondary | ICD-10-CM | POA: Diagnosis not present

## 2020-08-06 DIAGNOSIS — I2510 Atherosclerotic heart disease of native coronary artery without angina pectoris: Secondary | ICD-10-CM | POA: Diagnosis not present

## 2020-08-24 DIAGNOSIS — S8391XA Sprain of unspecified site of right knee, initial encounter: Secondary | ICD-10-CM | POA: Diagnosis not present

## 2020-09-06 DIAGNOSIS — Z0000 Encounter for general adult medical examination without abnormal findings: Secondary | ICD-10-CM | POA: Diagnosis not present

## 2020-09-06 DIAGNOSIS — Z1389 Encounter for screening for other disorder: Secondary | ICD-10-CM | POA: Diagnosis not present

## 2020-09-07 DIAGNOSIS — M25561 Pain in right knee: Secondary | ICD-10-CM | POA: Diagnosis not present

## 2020-09-10 DIAGNOSIS — L309 Dermatitis, unspecified: Secondary | ICD-10-CM | POA: Diagnosis not present

## 2020-09-11 ENCOUNTER — Ambulatory Visit: Payer: PPO | Admitting: Cardiovascular Disease

## 2020-09-18 DIAGNOSIS — N1831 Chronic kidney disease, stage 3a: Secondary | ICD-10-CM | POA: Diagnosis not present

## 2020-09-24 DIAGNOSIS — I129 Hypertensive chronic kidney disease with stage 1 through stage 4 chronic kidney disease, or unspecified chronic kidney disease: Secondary | ICD-10-CM | POA: Diagnosis not present

## 2020-09-24 DIAGNOSIS — N1832 Chronic kidney disease, stage 3b: Secondary | ICD-10-CM | POA: Diagnosis not present

## 2020-09-24 DIAGNOSIS — R001 Bradycardia, unspecified: Secondary | ICD-10-CM | POA: Diagnosis not present

## 2020-09-24 DIAGNOSIS — E1122 Type 2 diabetes mellitus with diabetic chronic kidney disease: Secondary | ICD-10-CM | POA: Diagnosis not present

## 2020-09-24 DIAGNOSIS — I2510 Atherosclerotic heart disease of native coronary artery without angina pectoris: Secondary | ICD-10-CM | POA: Diagnosis not present

## 2020-10-02 ENCOUNTER — Other Ambulatory Visit: Payer: Self-pay | Admitting: Sports Medicine

## 2020-10-02 ENCOUNTER — Ambulatory Visit
Admission: RE | Admit: 2020-10-02 | Discharge: 2020-10-02 | Disposition: A | Discharge status: Home / Self Care | Payer: PPO | Source: Ambulatory Visit | Attending: Sports Medicine | Admitting: Sports Medicine

## 2020-10-02 DIAGNOSIS — M25561 Pain in right knee: Secondary | ICD-10-CM

## 2020-10-09 DIAGNOSIS — M1711 Unilateral primary osteoarthritis, right knee: Secondary | ICD-10-CM | POA: Diagnosis not present

## 2020-10-09 DIAGNOSIS — N3281 Overactive bladder: Secondary | ICD-10-CM | POA: Diagnosis not present

## 2020-10-09 DIAGNOSIS — E1169 Type 2 diabetes mellitus with other specified complication: Secondary | ICD-10-CM | POA: Diagnosis not present

## 2020-10-09 DIAGNOSIS — K219 Gastro-esophageal reflux disease without esophagitis: Secondary | ICD-10-CM | POA: Diagnosis not present

## 2020-10-09 DIAGNOSIS — E785 Hyperlipidemia, unspecified: Secondary | ICD-10-CM | POA: Diagnosis not present

## 2020-10-09 DIAGNOSIS — F411 Generalized anxiety disorder: Secondary | ICD-10-CM | POA: Diagnosis not present

## 2020-10-09 DIAGNOSIS — I10 Essential (primary) hypertension: Secondary | ICD-10-CM | POA: Diagnosis not present

## 2020-10-09 DIAGNOSIS — M545 Low back pain, unspecified: Secondary | ICD-10-CM | POA: Diagnosis not present

## 2020-10-10 DIAGNOSIS — K219 Gastro-esophageal reflux disease without esophagitis: Secondary | ICD-10-CM | POA: Diagnosis not present

## 2020-10-10 DIAGNOSIS — I2510 Atherosclerotic heart disease of native coronary artery without angina pectoris: Secondary | ICD-10-CM | POA: Diagnosis not present

## 2020-10-10 DIAGNOSIS — E1169 Type 2 diabetes mellitus with other specified complication: Secondary | ICD-10-CM | POA: Diagnosis not present

## 2020-10-10 DIAGNOSIS — F3341 Major depressive disorder, recurrent, in partial remission: Secondary | ICD-10-CM | POA: Diagnosis not present

## 2020-10-10 DIAGNOSIS — N184 Chronic kidney disease, stage 4 (severe): Secondary | ICD-10-CM | POA: Diagnosis not present

## 2020-10-10 DIAGNOSIS — I10 Essential (primary) hypertension: Secondary | ICD-10-CM | POA: Diagnosis not present

## 2020-10-10 DIAGNOSIS — M1711 Unilateral primary osteoarthritis, right knee: Secondary | ICD-10-CM | POA: Diagnosis not present

## 2020-10-10 DIAGNOSIS — E785 Hyperlipidemia, unspecified: Secondary | ICD-10-CM | POA: Diagnosis not present

## 2020-10-11 ENCOUNTER — Other Ambulatory Visit: Payer: Self-pay | Admitting: Family Medicine

## 2020-10-11 DIAGNOSIS — Z1231 Encounter for screening mammogram for malignant neoplasm of breast: Secondary | ICD-10-CM

## 2020-11-13 ENCOUNTER — Encounter (INDEPENDENT_AMBULATORY_CARE_PROVIDER_SITE_OTHER): Payer: PPO | Admitting: Ophthalmology

## 2020-11-13 DIAGNOSIS — I10 Essential (primary) hypertension: Secondary | ICD-10-CM

## 2020-11-13 DIAGNOSIS — E119 Type 2 diabetes mellitus without complications: Secondary | ICD-10-CM

## 2020-11-13 DIAGNOSIS — H4421 Degenerative myopia, right eye: Secondary | ICD-10-CM

## 2020-11-13 DIAGNOSIS — H353132 Nonexudative age-related macular degeneration, bilateral, intermediate dry stage: Secondary | ICD-10-CM

## 2020-11-13 DIAGNOSIS — Z961 Presence of intraocular lens: Secondary | ICD-10-CM

## 2020-11-13 DIAGNOSIS — H3581 Retinal edema: Secondary | ICD-10-CM

## 2020-11-13 DIAGNOSIS — H25811 Combined forms of age-related cataract, right eye: Secondary | ICD-10-CM

## 2020-11-13 DIAGNOSIS — H35033 Hypertensive retinopathy, bilateral: Secondary | ICD-10-CM

## 2020-11-27 ENCOUNTER — Encounter (INDEPENDENT_AMBULATORY_CARE_PROVIDER_SITE_OTHER): Payer: PPO | Admitting: Ophthalmology

## 2020-11-29 ENCOUNTER — Ambulatory Visit
Admission: RE | Admit: 2020-11-29 | Discharge: 2020-11-29 | Disposition: A | Discharge status: Home / Self Care | Payer: PPO | Source: Ambulatory Visit

## 2020-11-29 ENCOUNTER — Other Ambulatory Visit: Payer: Self-pay

## 2020-11-29 DIAGNOSIS — Z1231 Encounter for screening mammogram for malignant neoplasm of breast: Secondary | ICD-10-CM

## 2020-12-03 DIAGNOSIS — Z03818 Encounter for observation for suspected exposure to other biological agents ruled out: Secondary | ICD-10-CM | POA: Diagnosis not present

## 2020-12-03 DIAGNOSIS — Z20822 Contact with and (suspected) exposure to covid-19: Secondary | ICD-10-CM | POA: Diagnosis not present

## 2020-12-04 NOTE — Progress Notes (Signed)
[facility] Note  12/05/2020     CHIEF COMPLAINT Patient presents for Retina Follow Up   HISTORY OF PRESENT ILLNESS: Kimberly Rivers is a 70 y.o. female who presents to the clinic today for:   HPI     Retina Follow Up   Patient presents with  Diabetic Retinopathy.  In both eyes.  Severity is moderate.  Duration of 12 months.  Since onset it is stable.  I, the attending physician,  performed the HPI with the patient and updated documentation appropriately.        Comments   Patient states unsure if any vision changes. No difficulty with reading, but difficulty with seeing type print on TV screen.       Last edited by Bernarda Caffey, MD on 12/09/2020  1:17 AM.    Pt states she feels like her vision is okay, but she is having trouble reading subtitles on the TV, she states she saw Dr. Eulas Post in January, but he did not give her a new glasses rx  Referring physician: No referring provider defined for this encounter.  HISTORICAL INFORMATION:   Selected notes from the MEDICAL RECORD NUMBER Referred by [facility] for DM exam LEE: 06.17.19 (Dr. Nada Libman, MD) [BCVA: OD: 20/25 OS: 20/30--]  Ocular Hx-non-exu ARMD, cataract OS, pseudo OD PMH-DM, CKD, HTN    CURRENT MEDICATIONS: Current Outpatient Medications (Ophthalmic Drugs)  Medication Sig   Carboxymethylcellulose Sodium (EYE DROPS OP) Apply to eye. Theratears Drops as needed in the mornings.   Carboxymethylcellulose Sodium (EYE DROPS OP) Apply to eye. Refresh Drops as needed before bed   No current facility-administered medications for this visit. (Ophthalmic Drugs)   Current Outpatient Medications (Other)  Medication Sig   aspirin EC 81 MG tablet Take 81 mg by mouth daily.   citalopram (CELEXA) 40 MG tablet Take 40 mg by mouth daily.   CRANBERRY PO Take 4,200 capsules by mouth daily.   enalapril (VASOTEC) 5 MG tablet Take 1 tablet (5 mg total) by mouth daily.   Ergocalciferol  (VITAMIN D2 PO) Take by mouth. 2-3 times a week   Ferrous Sulfate (IRON) 325 (65 Fe) MG TABS Take 1 tablet by mouth daily.   gabapentin (NEURONTIN) 300 MG capsule Take 300 mg by mouth at bedtime.    Lactobacillus (PROBIOTIC ACIDOPHILUS PO) Take by mouth.   lubiprostone (AMITIZA) 24 MCG capsule Take 24 mcg by mouth 2 (two) times daily.   Multiple Vitamins-Minerals (PRESERVISION AREDS PO) Take by mouth. Uses 2 times daily   NIFEdipine (PROCARDIA XL/NIFEDICAL XL) 60 MG 24 hr tablet Take 1 tablet (60 mg total) by mouth daily.   oxybutynin (DITROPAN-XL) 5 MG 24 hr tablet Take 5 mg by mouth at bedtime.   repaglinide (PRANDIN) 0.5 MG tablet    simvastatin (ZOCOR) 10 MG tablet Take 10 mg by mouth daily.   vitamin C (ASCORBIC ACID) 500 MG tablet Take 500 mg by mouth daily.   ONETOUCH ULTRA test strip USE 1 STRIP 1 2 TIMES DAILY AS DIRECTED 50   ranitidine (ZANTAC) 150 MG tablet Take 150 mg by mouth as directed.   No current facility-administered medications for this visit. (Other)      REVIEW OF SYSTEMS: ROS   Positive for: Endocrine, Cardiovascular, Eyes Negative for: Constitutional, Gastrointestinal, Neurological, Skin, Genitourinary, Musculoskeletal, HENT, Respiratory, Psychiatric, Allergic/Imm, Heme/Lymph Last edited by Roselee Nova D, COT on 12/05/2020 12:50 PM.        ALLERGIES Allergies  Allergen  Reactions   Clindamycin/Lincomycin    Doxycycline    Penicillins     PAST MEDICAL HISTORY Past Medical History:  Diagnosis Date   Cataract    OD   Chronic kidney disease    Diabetes mellitus without complication (HCC)    Hypertension    Hypertensive retinopathy    OU   Macular degeneration    Dry OU   Past Surgical History:  Procedure Laterality Date   CATARACT EXTRACTION Left    EYE SURGERY Left    Cat Sx    FAMILY HISTORY Family History  Problem Relation Age of Onset   Hypertension Mother    Heart disease Mother    Diabetes Mother    Canavan disease Father     Heart disease Maternal Grandmother    Heart disease Maternal Grandfather    Kidney failure Maternal Grandfather     SOCIAL HISTORY Social History   Tobacco Use   Smoking status: Never   Smokeless tobacco: Never  Vaping Use   Vaping Use: Never used         OPHTHALMIC EXAM:  Base Eye Exam     Visual Acuity (Snellen - Linear)       Right Left   Dist cc 20/40 -1 20/40   Dist ph cc 20/40 +2 20/40 +2    Correction: Glasses         Tonometry (Tonopen, 12:57 PM)       Right Left   Pressure 18 18         Pupils       Dark Light Shape React APD   Right 3 2 Round Brisk None   Left 3 2 Round Brisk None         Visual Fields (Counting fingers)       Left Right    Full Full         Extraocular Movement       Right Left    Full, Ortho Full, Ortho         Neuro/Psych     Oriented x3: Yes   Mood/Affect: Normal         Dilation     Both eyes: 1.0% Mydriacyl, 2.5% Phenylephrine @ 12:57 PM           Slit Lamp and Fundus Exam     Slit Lamp Exam       Right Left   Lids/Lashes Dermatochalasis - upper lid Dermatochalasis - upper lid   Conjunctiva/Sclera White and quiet White and quiet   Cornea Arcus, 1+ Punctate epithelial erosions Arcus, 1+Punctate epithelial erosions, Well healed cataract wounds   Anterior Chamber deep and clear deep and clear   Iris Round and dilated, No NVI Round and dilated, No NVI   Lens 3+ Nuclear sclerosis with brunescence, 3+Cortical cataract PC IOL in good position with PC opening   Vitreous Vitreous syneresis, Posterior vitreous detachment, vitreous condensations Vitreous syneresis, Posterior vitreous detachment         Fundus Exam       Right Left   Disc Compact, mild Peripapillary atrophy, Pink and Sharp Compact, mild tilt, temporal PPA   C/D Ratio 0.2 0.3   Macula Flat, Blunted foveal reflex, RPE mottling and clumping, Drusen, focal atrophy nasal to fovea, no heme, +PED Flat, good foveal reflex, fine  drusen, mild Retinal pigment epithelial mottling, No heme or edema   Vessels Vascular attenuation, Tortuous, mild Copper wiring, +mild AV crossing changes Vascular attenuation, Tortuous   Periphery  Attached, No heme  Attached, No heme, peripheral cystoid degeneration           Refraction     Wearing Rx       Sphere Cylinder Axis Add   Right -2.00 +0.75 175 +2.75   Left -0.25 +1.00 105 +2.75            IMAGING AND PROCEDURES  Imaging and Procedures for '@TODAY'$ @  OCT, Retina - OU - Both Eyes       Right Eye Quality was good. Central Foveal Thickness: 295. Progression has been stable. Findings include normal foveal contour, no SRF, no IRF, pigment epithelial detachment, outer retinal atrophy, retinal drusen (Mild interval increase in central PED).   Left Eye Quality was good. Central Foveal Thickness: 275. Progression has been stable. Findings include normal foveal contour, no SRF, no IRF, retinal drusen (Very shallow peripheral schisis temporal periphery caught on widefield).   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 CME - cystoid macular edema. PED - pigment epithelial detachment. IRF - intraretinal fluid. SRF - subretinal fluid. EZ - ellipsoid zone. ERM - epiretinal membrane. ORA - outer retinal atrophy. ORT - outer retinal tubulation. SRHM - subretinal hyper-reflective material               ASSESSMENT/PLAN:    ICD-10-CM   1. Diabetes mellitus type 2 without retinopathy ([place])  E11.9     2. Retinal edema  H35.81 OCT, Retina - OU - Both Eyes    3. Intermediate stage nonexudative age-related macular degeneration of both eyes  H35.3132     4. Myopic degeneration, right  H44.21     5.  Essential hypertension  I10     6. Hypertensive retinopathy of both eyes  H35.033     7. Pseudophakia of left eye  Z96.1     8. Combined forms of age-related cataract of right eye  H25.811        1. Diabetes mellitus, type 2 without retinopathy, OU  - stable  - The incidence, risk factors for progression, natural history and treatment options for diabetic retinopathy  were discussed with patient.    - The need for close monitoring of blood glucose, blood pressure, and serum lipids, avoiding cigarette or any type of tobacco, and the need for long term follow up was also discussed with patient.  - monitor  2,3. Age related macular degeneration, non-exudative, both eyes -- stable  - The incidence, anatomy, and pathology of dry AMD, risk of progression, and the AREDS and AREDS 2 study including smoking risks discussed with patient.  - OCT and exam show progression of PED OD, will f/u in 4 months  - continue amsler grid monitoring  - continue AREDS 2 supplements  - f/u 4 months, DFE, OCT  4. Myopic degeneration OD  - mild low lying PED w/ overlying ORA nasal to fovea  - stable without IRF/SRF  - monitor  5,6. Hypertensive retinopathy OU  - discussed importance of tight BP control  - monitor  7. Pseudophakia OS  - s/p CE/IOL and YAG cap OS  - beautiful surgeries, doing well  - surgery done around 2017, Dr. Caleen Essex. [facility] in [place]  - monitor  8. Mixed  cataract OD  - The symptoms of cataract, surgical options, and treatments and risks were discussed with patient.  - discussed diagnosis and progression  - approaching visual significance  - evaluated by [facility] Gala Romney [facility] -- monitoring for now   Ophthalmic Meds Ordered this visit:  No orders of the defined types were placed in this encounter.  Return in about 4 months (around 04/06/2021) for f/u exu ARMD OU, DFE, OCT.  There are no Patient Instructions on file for this visit.  This document serves  as a record of services personally performed by Gardiner Sleeper, MD, PhD. It was created on their behalf by Orvan Falconer, an ophthalmic technician. The creation of this record is the provider's dictation and/or activities during the visit.    Electronically signed by: Orvan Falconer, OA, 12/09/20  1:19 AM   Gardiner Sleeper, M.D., Ph.D. Diseases & Surgery of the Retina and Vitreous [facility]  I have reviewed the above documentation for accuracy and completeness, and I agree with the above. Gardiner Sleeper, M.D., Ph.D. 12/09/20 1:19 AM   Abbreviations: M myopia (nearsighted); A astigmatism; H hyperopia (farsighted); P presbyopia; Mrx spectacle prescription;  CTL contact lenses; OD right eye; OS left eye; OU both eyes  XT exotropia; ET esotropia; PEK punctate epithelial keratitis; PEE punctate epithelial erosions; DES dry eye syndrome; MGD meibomian gland dysfunction; ATs artificial tears; PFAT's preservative free artificial tears; [place] nuclear sclerotic cataract; PSC posterior subcapsular cataract; ERM epi-retinal membrane; PVD posterior vitreous detachment; RD retinal detachment; DM diabetes mellitus; DR diabetic retinopathy; NPDR non-proliferative diabetic retinopathy; PDR proliferative diabetic retinopathy; CSME clinically significant macular edema; DME diabetic macular edema; dbh dot blot hemorrhages; CWS cotton wool spot; POAG primary open angle glaucoma; C/D cup-to-disc ratio; HVF humphrey visual field; GVF goldmann visual field; OCT optical coherence tomography; IOP intraocular pressure; BRVO Branch retinal vein occlusion; CRVO central retinal vein occlusion; CRAO central retinal artery occlusion; BRAO branch retinal artery occlusion; RT retinal tear; SB scleral buckle; PPV pars plana vitrectomy; VH Vitreous hemorrhage; PRP panretinal laser photocoagulation; IVK intravitreal kenalog; VMT vitreomacular traction; MH Macular hole;  NVD neovascularization of the disc; NVE  neovascularization elsewhere; AREDS age related eye disease study; ARMD age related macular degeneration; POAG primary open angle glaucoma; EBMD epithelial/anterior basement membrane dystrophy; ACIOL anterior chamber intraocular lens; IOL intraocular lens; PCIOL posterior chamber intraocular lens; Phaco/IOL phacoemulsification with intraocular lens placement; [place] photorefractive keratectomy; LASIK laser assisted in situ keratomileusis; HTN hypertension; DM diabetes mellitus; COPD chronic obstructive pulmonary disease

## 2020-12-05 ENCOUNTER — Encounter (INDEPENDENT_AMBULATORY_CARE_PROVIDER_SITE_OTHER): Payer: Self-pay | Admitting: Ophthalmology

## 2020-12-05 ENCOUNTER — Other Ambulatory Visit: Payer: Self-pay

## 2020-12-05 ENCOUNTER — Ambulatory Visit (INDEPENDENT_AMBULATORY_CARE_PROVIDER_SITE_OTHER): Payer: PPO | Admitting: Ophthalmology

## 2020-12-05 DIAGNOSIS — H25811 Combined forms of age-related cataract, right eye: Secondary | ICD-10-CM | POA: Diagnosis not present

## 2020-12-05 DIAGNOSIS — H4421 Degenerative myopia, right eye: Secondary | ICD-10-CM | POA: Diagnosis not present

## 2020-12-05 DIAGNOSIS — E119 Type 2 diabetes mellitus without complications: Secondary | ICD-10-CM | POA: Diagnosis not present

## 2020-12-05 DIAGNOSIS — H3581 Retinal edema: Secondary | ICD-10-CM

## 2020-12-05 DIAGNOSIS — Z961 Presence of intraocular lens: Secondary | ICD-10-CM | POA: Diagnosis not present

## 2020-12-05 DIAGNOSIS — H35033 Hypertensive retinopathy, bilateral: Secondary | ICD-10-CM

## 2020-12-05 DIAGNOSIS — I10 Essential (primary) hypertension: Secondary | ICD-10-CM | POA: Diagnosis not present

## 2020-12-05 DIAGNOSIS — H353132 Nonexudative age-related macular degeneration, bilateral, intermediate dry stage: Secondary | ICD-10-CM

## 2020-12-09 ENCOUNTER — Encounter (INDEPENDENT_AMBULATORY_CARE_PROVIDER_SITE_OTHER): Payer: Self-pay | Admitting: Ophthalmology

## 2020-12-31 DIAGNOSIS — E785 Hyperlipidemia, unspecified: Secondary | ICD-10-CM | POA: Diagnosis not present

## 2020-12-31 DIAGNOSIS — I2510 Atherosclerotic heart disease of native coronary artery without angina pectoris: Secondary | ICD-10-CM | POA: Diagnosis not present

## 2020-12-31 DIAGNOSIS — N184 Chronic kidney disease, stage 4 (severe): Secondary | ICD-10-CM | POA: Diagnosis not present

## 2020-12-31 DIAGNOSIS — M1711 Unilateral primary osteoarthritis, right knee: Secondary | ICD-10-CM | POA: Diagnosis not present

## 2020-12-31 DIAGNOSIS — H35033 Hypertensive retinopathy, bilateral: Secondary | ICD-10-CM | POA: Diagnosis not present

## 2020-12-31 DIAGNOSIS — F3341 Major depressive disorder, recurrent, in partial remission: Secondary | ICD-10-CM | POA: Diagnosis not present

## 2020-12-31 DIAGNOSIS — K219 Gastro-esophageal reflux disease without esophagitis: Secondary | ICD-10-CM | POA: Diagnosis not present

## 2020-12-31 DIAGNOSIS — E1169 Type 2 diabetes mellitus with other specified complication: Secondary | ICD-10-CM | POA: Diagnosis not present

## 2020-12-31 DIAGNOSIS — I10 Essential (primary) hypertension: Secondary | ICD-10-CM | POA: Diagnosis not present

## 2021-01-02 ENCOUNTER — Ambulatory Visit: Payer: PPO | Admitting: Cardiovascular Disease

## 2021-01-02 ENCOUNTER — Other Ambulatory Visit: Payer: Self-pay

## 2021-01-02 ENCOUNTER — Encounter: Payer: Self-pay | Admitting: Cardiovascular Disease

## 2021-01-02 VITALS — BP 124/52 | HR 49 | Ht 59.0 in | Wt 123.0 lb

## 2021-01-02 DIAGNOSIS — E782 Mixed hyperlipidemia: Secondary | ICD-10-CM | POA: Diagnosis not present

## 2021-01-02 DIAGNOSIS — I10 Essential (primary) hypertension: Secondary | ICD-10-CM

## 2021-01-02 DIAGNOSIS — R0989 Other specified symptoms and signs involving the circulatory and respiratory systems: Secondary | ICD-10-CM

## 2021-01-02 NOTE — Assessment & Plan Note (Signed)
History of CAD status post stenting at [facility] with a Taxus drug-eluting stent.  She then had stenting 2/29/2010 with 2 Endeavor stents to her LAD.  She had a negative Myoview stress test in normal 2D echo 08/26/2017.  She denies chest pain or shortness of breath.

## 2021-01-02 NOTE — Progress Notes (Signed)
01/02/2021 Kimberly Rivers   1951-01-13  [id number]  Primary Physician Kimberly Stalker, PA-C Primary Cardiologist: Kimberly Harp MD Kimberly Rivers, [place]  HPI:  Kimberly Rivers is a 70 y.o.   mildly overweight single Caucasian female with no children who relocated from the [place] to [facility] 3-1/2 years ago because of retirement and cost of living.  She was referred by Dr. Lynelle Rivers, her PCP, to be established in my practice for ongoing cardiovascular care.  I last saw her in the office 01/15/2018. 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  Risk factors include treated hypertension, diabetes and lipidemia.  Her mother did have a CABG.  She is never had a heart attack or stroke.  She had stenting at [facility] before 2010 with a Taxus express drug-eluting stent and then stenting 2/29/2010 with 2 Endeavor stents in her LAD.  She had a normal 2D echo and Myoview stress test by her cardiologist in [place] 08/26/2017.    Since I saw her a year and a half ago she continues to do well.  She denies chest pain or shortness of breath.   Current Meds  Medication Sig   aspirin EC 81 MG tablet Take 81 mg by mouth daily.   Carboxymethylcellulose Sodium (EYE DROPS OP) Apply to eye. Theratears Drops as needed in the mornings.   Carboxymethylcellulose Sodium (EYE DROPS OP) Apply to eye. Refresh Drops as needed before bed   citalopram (CELEXA) 20 MG tablet Take 20 mg by mouth daily.   CRANBERRY PO Take 4,200 capsules by mouth daily.   enalapril (VASOTEC) 5 MG tablet Take 1 tablet (5 mg total) by mouth daily.   Ergocalciferol (VITAMIN D2 PO) Take by mouth. 2-3 times a week   Ferrous Sulfate (IRON) 325 (65 Fe) MG TABS Take 1 tablet by mouth daily.   gabapentin (NEURONTIN) 300 MG capsule Take 300 mg by mouth at bedtime.    Lactobacillus (PROBIOTIC ACIDOPHILUS PO) Take by mouth.   lubiprostone (AMITIZA) 24 MCG capsule Take 24 mcg by mouth 2 (two) times daily.    Multiple Vitamins-Minerals (PRESERVISION AREDS PO) Take by mouth. Uses 2 times daily   NIFEdipine (PROCARDIA XL/NIFEDICAL XL) 60 MG 24 hr tablet Take 1 tablet (60 mg total) by mouth daily.   oxybutynin (DITROPAN-XL) 5 MG 24 hr tablet Take 5 mg by mouth at bedtime.   repaglinide (PRANDIN) 0.5 MG tablet    simvastatin (ZOCOR) 10 MG tablet Take 10 mg by mouth daily.   vitamin C (ASCORBIC ACID) 500 MG tablet Take 500 mg by mouth daily.     Allergies  Allergen Reactions   Clindamycin/Lincomycin    Doxycycline    Penicillins     Social History   Socioeconomic History   Marital status: Single    Spouse name: Not on file   Number of children: Not on file   Years of education: Not on file   Highest education level: Not on file  Occupational History   Not on file  Tobacco Use   Smoking status: Never   Smokeless tobacco: Never  Vaping Use   Vaping Use: Never used  Substance and Sexual Activity   Alcohol use: Not on file   Drug use: Not on file   Sexual activity: Not on file  Other Topics Concern   Not on file  Social History Narrative   Not on file   Social Determinants of Health  Financial Resource Strain: Not on file  Food Insecurity: Not on file  Transportation Needs: Not on file  Physical Activity: Not on file  Stress: Not on file  Social Connections: Not on file  Intimate Partner Violence: Not on file     Review of Systems: General: negative for chills, fever, night sweats or weight changes.  Cardiovascular: negative for chest pain, dyspnea on exertion, edema, orthopnea, palpitations, paroxysmal nocturnal dyspnea or shortness of breath Dermatological: negative for rash Respiratory: negative for cough or wheezing Urologic: negative for hematuria Abdominal: negative for nausea, vomiting, diarrhea, bright red blood per rectum, melena, or hematemesis Neurologic: negative for visual changes, syncope, or dizziness All other systems reviewed and are otherwise negative  except as noted above.    Blood pressure (!) 124/52, pulse (!) 49, height '4\' 11"'$  (1.499 m), weight 123 lb (55.8 kg), SpO2 98 %.  General appearance: alert and no distress Neck: no adenopathy, no JVD, supple, symmetrical, trachea midline, thyroid not enlarged, symmetric, no tenderness/mass/nodules, and soft left carotid bruit Lungs: clear to auscultation bilaterally Heart: Soft outflow tract murmur consistent with aortic sclerosis and/or stenosis Extremities: extremities normal, atraumatic, no cyanosis or edema Pulses: 2+ and symmetric Skin: Skin color, texture, turgor normal. No rashes or lesions Neurologic: Grossly normal  EKG sinus bradycardia 49 with right bundle branch block.  I personally reviewed this EKG.  ASSESSMENT AND PLAN:   Essential hypertension History of essential hypertension blood pressure measured today 124/52.  She is on enalapril and Procardia.  Hyperlipidemia History of hyperlipidemia on low-dose simvastatin with lipid profile performed 03/05/2020 revealing total cholesterol 197, LDL of 87 and HDL of 83, not at goal for secondary prevention.  We will recheck a fasting lipid liver profile.  We may need to uptitrate her simvastatin.  Coronary artery disease History of CAD status post stenting at [facility] with a Taxus drug-eluting stent.  She then had stenting 2/29/2010 with 2 Endeavor stents to her LAD.  She had a negative Myoview stress test in normal 2D echo 08/26/2017.  She denies chest pain or shortness of breath.     Kimberly Harp MD FACP,FACC,FAHA, [employer] 01/02/2021 9:35 AM

## 2021-01-02 NOTE — Assessment & Plan Note (Signed)
History of hyperlipidemia on low-dose simvastatin with lipid profile performed 03/05/2020 revealing total cholesterol 197, LDL of 87 and HDL of 83, not at goal for secondary prevention.  We will recheck a fasting lipid liver profile.  We may need to uptitrate her simvastatin.

## 2021-01-02 NOTE — Patient Instructions (Signed)
Medication Instructions:  Your physician recommends that you continue on your current medications as directed. Please refer to the Current Medication list given to you today.  *If you need a refill on your cardiac medications before your next appointment, please call your pharmacy*   Lab Work: Your physician recommends that you return for lab work in: next week or 2 for FASTING lipid/liver profile.  If you have labs (blood work) drawn today and your tests are completely normal, you will receive your results only by: [place] (if you have MyChart) OR A paper copy in the mail If you have any lab test that is abnormal or we need to change your treatment, we will call you to review the results.   Testing/Procedures: Your physician has requested that you have an echocardiogram. Echocardiography is a painless test that uses sound waves to create images of your heart. It provides your doctor with information about the size and shape of your heart and how well your heart's chambers and valves are working. This procedure takes approximately one hour. There are no restrictions for this procedure. This procedure is done at [address] physician has requested that you have a carotid duplex. This test is an ultrasound of the carotid arteries in your neck. It looks at blood flow through these arteries that supply the brain with blood. Allow one hour for this exam. There are no restrictions or special instructions. This procedure is done [place].    Follow-Up: At [facility], you and your health needs are our priority.  As part of our continuing mission to provide you with exceptional heart care, we have created designated Provider Care Teams.  These Care Teams include your primary Cardiologist (physician) and Advanced Practice Providers (APPs -  Physician Assistants and Nurse Practitioners) who all work together to provide you with the care you need, when you need it.  We  recommend signing up for the patient portal called "MyChart".  Sign up information is provided on this After Visit Summary.  MyChart is used to connect with patients for Virtual Visits (Telemedicine).  Patients are able to view lab/test results, encounter notes, upcoming appointments, etc.  Non-urgent messages can be sent to your provider as well.   To learn more about what you can do with MyChart, go to [email or web address].    Your next appointment:   12 month(s)  The format for your next appointment:   In Person  Provider:   Quay Burow, MD

## 2021-01-02 NOTE — Assessment & Plan Note (Signed)
History of essential hypertension blood pressure measured today 124/52.  She is on enalapril and Procardia.

## 2021-01-09 DIAGNOSIS — E782 Mixed hyperlipidemia: Secondary | ICD-10-CM | POA: Diagnosis not present

## 2021-01-10 LAB — LIPID PANEL
Chol/HDL Ratio: 2.2 ratio (ref 0.0–4.4)
Cholesterol, Total: 187 mg/dL (ref 100–199)
HDL: 86 mg/dL (ref >39)
LDL Chol Calc (NIH): 90 mg/dL (ref 0–99)
Triglycerides: 59 mg/dL (ref 0–149)
VLDL Cholesterol Cal: 11 mg/dL (ref 5–40)

## 2021-01-10 LAB — HEPATIC FUNCTION PANEL
ALT: 11 IU/L (ref 0–32)
AST: 14 IU/L (ref 0–40)
Albumin: 4.6 g/dL (ref 3.7–4.7)
Alkaline Phosphatase: 50 IU/L (ref 44–121)
Bilirubin Total: 0.4 mg/dL (ref 0.0–1.2)
Bilirubin, Direct: 0.13 mg/dL (ref 0.00–0.40)
Total Protein: 5.8 g/dL — ABNORMAL LOW (ref 6.0–8.5)

## 2021-01-11 ENCOUNTER — Other Ambulatory Visit: Payer: Self-pay

## 2021-01-11 ENCOUNTER — Ambulatory Visit (HOSPITAL_COMMUNITY)
Admission: RE | Admit: 2021-01-11 | Discharge: 2021-01-11 | Disposition: A | Discharge status: Home / Self Care | Payer: PPO | Source: Ambulatory Visit | Attending: Cardiovascular Disease | Admitting: Cardiovascular Disease

## 2021-01-11 ENCOUNTER — Telehealth: Payer: Self-pay

## 2021-01-11 DIAGNOSIS — E782 Mixed hyperlipidemia: Secondary | ICD-10-CM

## 2021-01-11 DIAGNOSIS — R0989 Other specified symptoms and signs involving the circulatory and respiratory systems: Secondary | ICD-10-CM | POA: Diagnosis not present

## 2021-01-11 MED ORDER — SIMVASTATIN 20 MG PO TABS: 20.0000 mg | ORAL_TABLET | Freq: Every day | ORAL | 3 refills | Status: DC

## 2021-01-11 NOTE — Telephone Encounter (Signed)
Spoke with pt regarding results of lipid/liver profile. Per Dr. Gwenlyn Found LDL should be less than 70.  Per Dr. Gwenlyn Found, we will increase simvastatin to 20mg  once daily and repeat labs in 3 months. All questions answered and pt verbalizes understanding. Prescription send and lab orders placed.

## 2021-01-11 NOTE — Telephone Encounter (Signed)
-----   Message from Lorretta Harp, MD sent at 01/10/2021  6:33 AM EDT ----- Not at goal for secondary prevention. Increase Simva to 20 and re check FLP 3 months.LDL goal <70

## 2021-01-18 ENCOUNTER — Ambulatory Visit (HOSPITAL_COMMUNITY): Payer: PPO | Attending: Internal Medicine

## 2021-01-18 ENCOUNTER — Other Ambulatory Visit: Payer: Self-pay

## 2021-01-18 DIAGNOSIS — I10 Essential (primary) hypertension: Secondary | ICD-10-CM | POA: Insufficient documentation

## 2021-01-18 LAB — ECHOCARDIOGRAM COMPLETE
Area-P 1/2: 3.42 cm2
S' Lateral: 1.55 cm

## 2021-02-13 ENCOUNTER — Other Ambulatory Visit (HOSPITAL_COMMUNITY): Payer: Self-pay | Admitting: Cardiovascular Disease

## 2021-02-13 DIAGNOSIS — I6523 Occlusion and stenosis of bilateral carotid arteries: Secondary | ICD-10-CM

## 2021-03-04 ENCOUNTER — Other Ambulatory Visit: Payer: Self-pay | Admitting: Cardiovascular Disease

## 2021-04-08 ENCOUNTER — Other Ambulatory Visit: Payer: Self-pay | Admitting: Cardiovascular Disease

## 2021-04-08 DIAGNOSIS — E782 Mixed hyperlipidemia: Secondary | ICD-10-CM | POA: Diagnosis not present

## 2021-04-08 NOTE — Progress Notes (Signed)
[facility] Note  04/10/2021     CHIEF COMPLAINT Patient presents for Retina Follow Up    HISTORY OF PRESENT ILLNESS: Kimberly Rivers is a 71 y.o. female who presents to the clinic today for:   HPI     Retina Follow Up   Patient presents with  Dry AMD.  In both eyes.  Severity is moderate.  Duration of 4 months.  Since onset it is stable.  I, the attending physician,  performed the HPI with the patient and updated documentation appropriately.        Comments   Pt here for 4 mo ret f/u for non exu ARMD OU. Pt states vision is about the same though she thinks she needs new rx specs. Most recent A1C was around 6 from what she remembers. Reports controlled blood sugar levels.       Last edited by Bernarda Caffey, MD on 04/10/2021  4:32 PM.     Pt states she thinks she needs new glasses  Referring physician: Lonia Skinner, MD [place],  [place] [id number]  HISTORICAL INFORMATION:   Selected notes from the MEDICAL RECORD NUMBER Referred by [facility] for DM exam LEE: 06.17.19 (Dr. Nada Libman, MD) [BCVA: OD: 20/25 OS: 20/30--]  Ocular Hx-non-exu ARMD, cataract OS, pseudo OD PMH-DM, CKD, HTN    CURRENT MEDICATIONS: Current Outpatient Medications (Ophthalmic Drugs)  Medication Sig   Carboxymethylcellulose Sodium (EYE DROPS OP) Apply to eye. Refresh Drops as needed before bed   Carboxymethylcellulose Sodium (EYE DROPS OP) Apply to eye. Theratears Drops as needed in the mornings.   No current facility-administered medications for this visit. (Ophthalmic Drugs)   Current Outpatient Medications (Other)  Medication Sig   aspirin EC 81 MG tablet Take 81 mg by mouth daily.   citalopram (CELEXA) 20 MG tablet Take 20 mg by mouth daily.   CRANBERRY PO Take 4,200 capsules by mouth daily.   enalapril (VASOTEC) 5 MG tablet Take 1 tablet by mouth once daily   Ergocalciferol (VITAMIN D2 PO) Take by mouth. 2-3 times a  week   Ferrous Sulfate (IRON) 325 (65 Fe) MG TABS Take 1 tablet by mouth daily.   gabapentin (NEURONTIN) 300 MG capsule Take 300 mg by mouth at bedtime.    Lactobacillus (PROBIOTIC ACIDOPHILUS PO) Take by mouth.   lubiprostone (AMITIZA) 24 MCG capsule Take 24 mcg by mouth 2 (two) times daily.   Multiple Vitamins-Minerals (PRESERVISION AREDS PO) Take by mouth. Uses 2 times daily   NIFEdipine (PROCARDIA XL/NIFEDICAL XL) 60 MG 24 hr tablet Take 1 tablet (60 mg total) by mouth daily.   oxybutynin (DITROPAN-XL) 5 MG 24 hr tablet Take 5 mg by mouth at bedtime.   repaglinide (PRANDIN) 0.5 MG tablet    simvastatin (ZOCOR) 20 MG tablet Take 1 tablet (20 mg total) by mouth daily.   vitamin C (ASCORBIC ACID) 500 MG tablet Take 500 mg by mouth daily.   No current facility-administered medications for this visit. (Other)   REVIEW OF SYSTEMS: ROS   Positive for: Endocrine, Cardiovascular, Eyes Negative for: Constitutional, Gastrointestinal, Neurological, Skin, Genitourinary, Musculoskeletal, HENT, Respiratory, Psychiatric, Allergic/Imm, Heme/Lymph Last edited by Kingsley Spittle, COT on 04/10/2021  1:04 PM.     ALLERGIES Allergies  Allergen Reactions   Clindamycin/Lincomycin    Doxycycline    Penicillins    PAST MEDICAL HISTORY Past Medical History:  Diagnosis Date   Cataract    OD  Chronic kidney disease    Diabetes mellitus without complication (HCC)    Hypertension    Hypertensive retinopathy    OU   Macular degeneration    Dry OU   Past Surgical History:  Procedure Laterality Date   CATARACT EXTRACTION Left    EYE SURGERY Left    Cat Sx   FAMILY HISTORY Family History  Problem Relation Age of Onset   Hypertension Mother    Heart disease Mother    Diabetes Mother    Canavan disease Father    Heart disease Maternal Grandmother    Heart disease Maternal Grandfather    Kidney failure Maternal Grandfather    SOCIAL HISTORY Social History   Tobacco Use   Smoking  status: Never   Smokeless tobacco: Never  Vaping Use   Vaping Use: Never used  Substance Use Topics   Alcohol use: Not Currently   Drug use: Never       OPHTHALMIC EXAM:  Base Eye Exam     Visual Acuity (Snellen - Linear)       Right Left   Dist cc 20/50 +2 20/40   Dist ph cc 20/40 20/30 -1    Correction: Glasses         Tonometry (Tonopen, 1:11 PM)       Right Left   Pressure 13 17         Pupils       Dark Light Shape React APD   Right 3 2 Round Brisk None   Left 3 2 Round Brisk None         Visual Fields (Counting fingers)       Left Right    Full Full         Extraocular Movement       Right Left    Full, Ortho Full, Ortho         Neuro/Psych     Oriented x3: Yes   Mood/Affect: Normal         Dilation     Both eyes: 1.0% Mydriacyl, 2.5% Phenylephrine @ 1:11 PM           Slit Lamp and Fundus Exam     Slit Lamp Exam       Right Left   Lids/Lashes Dermatochalasis - upper lid Dermatochalasis - upper lid   Conjunctiva/Sclera White and quiet White and quiet   Cornea Arcus, trace Punctate epithelial erosions, mild Descemet's folds Arcus, 1+Punctate epithelial erosions, Well healed cataract wounds   Anterior Chamber deep and clear deep and clear   Iris Round and dilated, No NVI Round and dilated, No NVI   Lens 3+ Nuclear sclerosis with brunescence, 3+Cortical cataract PC IOL in good position with PC opening   Anterior Vitreous Vitreous syneresis, Posterior vitreous detachment, vitreous condensations Vitreous syneresis, Posterior vitreous detachment         Fundus Exam       Right Left   Disc Compact, mild Peripapillary atrophy, Pink and Sharp Compact, mild tilt, temporal PPA   C/D Ratio 0.2 0.3   Macula Flat, Blunted foveal reflex, RPE mottling and clumping, Drusen, focal atrophy nasal to fovea, no heme, +PED Flat, good foveal reflex, fine drusen, mild Retinal pigment epithelial mottling, No heme or edema   Vessels Vascular  attenuation, Tortuous, mild Copper wiring, +mild AV crossing changes Vascular attenuation, Tortuous   Periphery Attached, No heme Attached, No heme, peripheral cystoid degeneration           Refraction  Wearing Rx       Sphere Cylinder Axis Add   Right -2.00 +0.75 175 +2.75   Left -0.25 +1.00 105 +2.75            IMAGING AND PROCEDURES  Imaging and Procedures for '@TODAY' @  OCT, Retina - OU - Both Eyes       Right Eye Quality was good. Central Foveal Thickness: 291. Progression has been stable. Findings include normal foveal contour, no SRF, no IRF, pigment epithelial detachment, outer retinal atrophy, retinal drusen (Persistent central PED with adjacent ORA, no significant change from prior).   Left Eye Quality was good. Central Foveal Thickness: 276. Progression has been stable. Findings include normal foveal contour, no SRF, no IRF, retinal drusen (Very shallow peripheral schisis temporal periphery caught on widefield).   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 CME - cystoid macular edema. PED - pigment epithelial detachment. IRF - intraretinal fluid. SRF - subretinal fluid. EZ - ellipsoid zone. ERM - epiretinal membrane. ORA - outer retinal atrophy. ORT - outer retinal tubulation. SRHM - subretinal hyper-reflective material            ASSESSMENT/PLAN:    ICD-10-CM   1. Diabetes mellitus type 2 without retinopathy ([place])  E11.9     2. Intermediate stage nonexudative age-related macular degeneration of both eyes  H35.3132 OCT, Retina - OU - Both Eyes    3. 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 Essential hypertension  I10     5. Hypertensive  retinopathy of both eyes  H35.033     6. Pseudophakia of left eye  Z96.1     7. Combined forms of age-related cataract of right eye  H25.811      1. Diabetes mellitus, type 2 without retinopathy, OU  - stable  - The incidence, risk factors for progression, natural history and treatment options for diabetic retinopathy  were discussed with patient.    - The need for close monitoring of blood glucose, blood pressure, and serum lipids, avoiding cigarette or any type of tobacco, and the need for long term follow up was also discussed with patient.  - monitor  2. Age related macular degeneration, non-exudative, both eyes -- stable  - The incidence, anatomy, and pathology of dry AMD, risk of progression, and the AREDS and AREDS 2 study including smoking risks discussed with patient.  - OCT and exam show OD Persistent central PED with adjacent ORA, no significant change from prior  - continue amsler grid monitoring  - continue AREDS 2 supplements  - f/u 6-9 months, DFE, OCT  3. Myopic degeneration OD  - mild low lying PED w/ overlying ORA nasal to fovea  - stable without IRF/SRF  - monitor  4,5. Hypertensive retinopathy OU  - discussed importance of tight BP control  - monitor  6. Pseudophakia OS  - s/p CE/IOL and YAG cap OS  - beautiful surgeries, doing well  - surgery done around 2017, Dr. Caleen Essex. [facility] in [place]  - monitor  7. Mixed cataract OD  - The symptoms of cataract, surgical options, and treatments and risks were discussed with patient.  - discussed diagnosis and progression  -  approaching visual significance  - under the expert care of Dr. Gala Romney at [facility] Ordered this visit:  No orders of the defined types were placed in this encounter.   Return for f/u 6-9 months, non-exu ARMD OU, DFE, OCT.  There are no Patient Instructions on file for this visit.  This document serves as a record of services personally performed by  Gardiner Sleeper, MD, PhD. It was created on their behalf by San Jetty. Owens Shark, OA an ophthalmic technician. The creation of this record is the provider's dictation and/or activities during the visit.    Electronically signed by: San Jetty. Owens Shark, [place] 01.23.2023 4:35 PM   Gardiner Sleeper, M.D., Ph.D. Diseases & Surgery of the Retina and Vitreous [facility]  I have reviewed the above documentation for accuracy and completeness, and I agree with the above. Gardiner Sleeper, M.D., Ph.D. 04/10/21 4:35 PM   Abbreviations: M myopia (nearsighted); A astigmatism; H hyperopia (farsighted); P presbyopia; Mrx spectacle prescription;  CTL contact lenses; OD right eye; OS left eye; OU both eyes  XT exotropia; ET esotropia; PEK punctate epithelial keratitis; PEE punctate epithelial erosions; DES dry eye syndrome; MGD meibomian gland dysfunction; ATs artificial tears; PFAT's preservative free artificial tears; [place] nuclear sclerotic cataract; PSC posterior subcapsular cataract; ERM epi-retinal membrane; PVD posterior vitreous detachment; RD retinal detachment; DM diabetes mellitus; DR diabetic retinopathy; NPDR non-proliferative diabetic retinopathy; PDR proliferative diabetic retinopathy; CSME clinically significant macular edema; DME diabetic macular edema; dbh dot blot hemorrhages; CWS cotton wool spot; POAG primary open angle glaucoma; C/D cup-to-disc ratio; HVF humphrey visual field; GVF goldmann visual field; OCT optical coherence tomography; IOP intraocular pressure; BRVO Branch retinal vein occlusion; CRVO central retinal vein occlusion; CRAO central retinal artery occlusion; BRAO branch retinal artery occlusion; RT retinal tear; SB scleral buckle; PPV pars plana vitrectomy; VH Vitreous hemorrhage; PRP panretinal laser photocoagulation; IVK intravitreal kenalog; VMT vitreomacular traction; MH Macular hole;  NVD neovascularization of the disc; NVE neovascularization elsewhere; AREDS age related  eye disease study; ARMD age related macular degeneration; POAG primary open angle glaucoma; EBMD epithelial/anterior basement membrane dystrophy; ACIOL anterior chamber intraocular lens; IOL intraocular lens; PCIOL posterior chamber intraocular lens; Phaco/IOL phacoemulsification with intraocular lens placement; [place] photorefractive keratectomy; LASIK laser assisted in situ keratomileusis; HTN hypertension; DM diabetes mellitus; COPD chronic obstructive pulmonary disease

## 2021-04-10 ENCOUNTER — Ambulatory Visit (INDEPENDENT_AMBULATORY_CARE_PROVIDER_SITE_OTHER): Payer: PPO | Admitting: Ophthalmology

## 2021-04-10 ENCOUNTER — Encounter (INDEPENDENT_AMBULATORY_CARE_PROVIDER_SITE_OTHER): Payer: Self-pay | Admitting: Ophthalmology

## 2021-04-10 ENCOUNTER — Other Ambulatory Visit: Payer: Self-pay

## 2021-04-10 DIAGNOSIS — H353132 Nonexudative age-related macular degeneration, bilateral, intermediate dry stage: Secondary | ICD-10-CM

## 2021-04-10 DIAGNOSIS — Z961 Presence of intraocular lens: Secondary | ICD-10-CM | POA: Diagnosis not present

## 2021-04-10 DIAGNOSIS — H35033 Hypertensive retinopathy, bilateral: Secondary | ICD-10-CM

## 2021-04-10 DIAGNOSIS — E119 Type 2 diabetes mellitus without complications: Secondary | ICD-10-CM

## 2021-04-10 DIAGNOSIS — H25811 Combined forms of age-related cataract, right eye: Secondary | ICD-10-CM

## 2021-04-10 DIAGNOSIS — I10 Essential (primary) hypertension: Secondary | ICD-10-CM

## 2021-04-10 DIAGNOSIS — H4421 Degenerative myopia, right eye: Secondary | ICD-10-CM | POA: Diagnosis not present

## 2021-04-10 LAB — LIPID PANEL
Chol/HDL Ratio: 2.2 ratio
Cholesterol, Total: 199 mg/dL
HDL: 91 mg/dL (ref >39)
LDL Chol Calc (NIH): 90 mg/dL
Triglycerides: 103 mg/dL
VLDL Cholesterol Cal: 18 mg/dL (ref 5–40)

## 2021-04-10 LAB — HEPATIC FUNCTION PANEL
ALT: 13 IU/L
AST: 11 IU/L (ref 0–40)
Albumin: 4.7 g/dL (ref 3.7–4.7)
Alkaline Phosphatase: 51 IU/L (ref 44–121)
Bilirubin Total: 0.3 mg/dL (ref 0.0–1.2)
Bilirubin, Direct: <0.1 mg/dL (ref 0.00–0.40)
Total Protein: 6.1 g/dL (ref 6.0–8.5)

## 2021-04-11 DIAGNOSIS — F411 Generalized anxiety disorder: Secondary | ICD-10-CM | POA: Diagnosis not present

## 2021-04-11 DIAGNOSIS — N184 Chronic kidney disease, stage 4 (severe): Secondary | ICD-10-CM | POA: Diagnosis not present

## 2021-04-11 DIAGNOSIS — I2510 Atherosclerotic heart disease of native coronary artery without angina pectoris: Secondary | ICD-10-CM | POA: Diagnosis not present

## 2021-04-11 DIAGNOSIS — K219 Gastro-esophageal reflux disease without esophagitis: Secondary | ICD-10-CM | POA: Diagnosis not present

## 2021-04-11 DIAGNOSIS — N812 Incomplete uterovaginal prolapse: Secondary | ICD-10-CM | POA: Diagnosis not present

## 2021-04-11 DIAGNOSIS — E785 Hyperlipidemia, unspecified: Secondary | ICD-10-CM | POA: Diagnosis not present

## 2021-04-11 DIAGNOSIS — E1169 Type 2 diabetes mellitus with other specified complication: Secondary | ICD-10-CM | POA: Diagnosis not present

## 2021-04-11 DIAGNOSIS — K5904 Chronic idiopathic constipation: Secondary | ICD-10-CM | POA: Diagnosis not present

## 2021-04-12 ENCOUNTER — Telehealth: Payer: Self-pay | Admitting: *Deleted

## 2021-04-12 DIAGNOSIS — E782 Mixed hyperlipidemia: Secondary | ICD-10-CM

## 2021-04-12 MED ORDER — ATORVASTATIN CALCIUM 20 MG PO TABS
20.0000 mg | ORAL_TABLET | Freq: Every day | ORAL | 3 refills | Status: DC
Start: 2021-04-12 — End: 2021-07-17

## 2021-04-12 NOTE — Telephone Encounter (Signed)
-----   Message from Lorretta Harp, MD sent at 04/11/2021  5:01 PM EST ----- LDL 90 on SImva 10. Noy at goal for secondary prevention. Change to Atorva 20 and re check in 3 months. LDL goal <70

## 2021-04-12 NOTE — Telephone Encounter (Signed)
pt aware of results  New script sent to the pharmacy  Lab orders mailed to the pt  

## 2021-05-15 DIAGNOSIS — H524 Presbyopia: Secondary | ICD-10-CM | POA: Diagnosis not present

## 2021-05-15 DIAGNOSIS — E119 Type 2 diabetes mellitus without complications: Secondary | ICD-10-CM | POA: Diagnosis not present

## 2021-05-15 DIAGNOSIS — H52223 Regular astigmatism, bilateral: Secondary | ICD-10-CM | POA: Diagnosis not present

## 2021-05-15 DIAGNOSIS — H35033 Hypertensive retinopathy, bilateral: Secondary | ICD-10-CM | POA: Diagnosis not present

## 2021-05-15 DIAGNOSIS — H25811 Combined forms of age-related cataract, right eye: Secondary | ICD-10-CM | POA: Diagnosis not present

## 2021-05-15 DIAGNOSIS — H5203 Hypermetropia, bilateral: Secondary | ICD-10-CM | POA: Diagnosis not present

## 2021-05-15 DIAGNOSIS — H353132 Nonexudative age-related macular degeneration, bilateral, intermediate dry stage: Secondary | ICD-10-CM | POA: Diagnosis not present

## 2021-06-03 ENCOUNTER — Other Ambulatory Visit: Payer: Self-pay | Admitting: Cardiovascular Disease

## 2021-06-12 DIAGNOSIS — H25811 Combined forms of age-related cataract, right eye: Secondary | ICD-10-CM | POA: Diagnosis not present

## 2021-07-09 DIAGNOSIS — H25811 Combined forms of age-related cataract, right eye: Secondary | ICD-10-CM | POA: Diagnosis not present

## 2021-07-15 DIAGNOSIS — E782 Mixed hyperlipidemia: Secondary | ICD-10-CM | POA: Diagnosis not present

## 2021-07-15 LAB — LIPID PANEL
Chol/HDL Ratio: 2.7 ratio (ref 0.0–4.4)
Cholesterol, Total: 173 mg/dL (ref 100–199)
HDL: 64 mg/dL (ref >39)
LDL Chol Calc (NIH): 90 mg/dL (ref 0–99)
Triglycerides: 107 mg/dL (ref 0–149)
VLDL Cholesterol Cal: 19 mg/dL (ref 5–40)

## 2021-07-15 LAB — HEPATIC FUNCTION PANEL
ALT: 18 IU/L (ref 0–32)
AST: 14 IU/L (ref 0–40)
Albumin: 4.6 g/dL (ref 3.7–4.7)
Alkaline Phosphatase: 58 IU/L (ref 44–121)
Bilirubin Total: 0.4 mg/dL (ref 0.0–1.2)
Bilirubin, Direct: 0.12 mg/dL (ref 0.00–0.40)
Total Protein: 6 g/dL (ref 6.0–8.5)

## 2021-07-17 ENCOUNTER — Telehealth: Payer: Self-pay | Admitting: *Deleted

## 2021-07-17 DIAGNOSIS — E782 Mixed hyperlipidemia: Secondary | ICD-10-CM

## 2021-07-17 MED ORDER — ATORVASTATIN CALCIUM 40 MG PO TABS: 40.0000 mg | ORAL_TABLET | Freq: Every day | ORAL | 3 refills | Status: DC

## 2021-07-17 NOTE — Telephone Encounter (Signed)
-----   Message from Lorretta Harp, MD sent at 07/16/2021  6:28 AM EDT ----- ?Not at goal for secondary prevention. Increase Simva 10--->20 mg and recheck FLP 3 months ?

## 2021-07-17 NOTE — Telephone Encounter (Signed)
pt aware of results  New script sent to the pharmacy  Lab orders mailed to the pt  

## 2021-08-05 DIAGNOSIS — E1169 Type 2 diabetes mellitus with other specified complication: Secondary | ICD-10-CM | POA: Diagnosis not present

## 2021-08-05 DIAGNOSIS — I10 Essential (primary) hypertension: Secondary | ICD-10-CM | POA: Diagnosis not present

## 2021-08-05 DIAGNOSIS — E785 Hyperlipidemia, unspecified: Secondary | ICD-10-CM | POA: Diagnosis not present

## 2021-08-05 DIAGNOSIS — M1711 Unilateral primary osteoarthritis, right knee: Secondary | ICD-10-CM | POA: Diagnosis not present

## 2021-08-05 DIAGNOSIS — N184 Chronic kidney disease, stage 4 (severe): Secondary | ICD-10-CM | POA: Diagnosis not present

## 2021-08-05 DIAGNOSIS — K219 Gastro-esophageal reflux disease without esophagitis: Secondary | ICD-10-CM | POA: Diagnosis not present

## 2021-08-15 DIAGNOSIS — M8580 Other specified disorders of bone density and structure, unspecified site: Secondary | ICD-10-CM | POA: Diagnosis not present

## 2021-08-15 DIAGNOSIS — M25551 Pain in right hip: Secondary | ICD-10-CM | POA: Diagnosis not present

## 2021-08-20 DIAGNOSIS — K5904 Chronic idiopathic constipation: Secondary | ICD-10-CM | POA: Diagnosis not present

## 2021-08-20 DIAGNOSIS — N8189 Other female genital prolapse: Secondary | ICD-10-CM | POA: Diagnosis not present

## 2021-08-28 DIAGNOSIS — M25551 Pain in right hip: Secondary | ICD-10-CM | POA: Diagnosis not present

## 2021-08-30 DIAGNOSIS — M25651 Stiffness of right hip, not elsewhere classified: Secondary | ICD-10-CM | POA: Diagnosis not present

## 2021-08-30 DIAGNOSIS — R293 Abnormal posture: Secondary | ICD-10-CM | POA: Diagnosis not present

## 2021-08-30 DIAGNOSIS — M6281 Muscle weakness (generalized): Secondary | ICD-10-CM | POA: Diagnosis not present

## 2021-08-30 DIAGNOSIS — M25551 Pain in right hip: Secondary | ICD-10-CM | POA: Diagnosis not present

## 2021-09-03 DIAGNOSIS — M25551 Pain in right hip: Secondary | ICD-10-CM | POA: Diagnosis not present

## 2021-09-03 DIAGNOSIS — R293 Abnormal posture: Secondary | ICD-10-CM | POA: Diagnosis not present

## 2021-09-03 DIAGNOSIS — M6281 Muscle weakness (generalized): Secondary | ICD-10-CM | POA: Diagnosis not present

## 2021-09-03 DIAGNOSIS — M25651 Stiffness of right hip, not elsewhere classified: Secondary | ICD-10-CM | POA: Diagnosis not present

## 2021-09-05 DIAGNOSIS — M25551 Pain in right hip: Secondary | ICD-10-CM | POA: Diagnosis not present

## 2021-09-05 DIAGNOSIS — M6281 Muscle weakness (generalized): Secondary | ICD-10-CM | POA: Diagnosis not present

## 2021-09-05 DIAGNOSIS — R293 Abnormal posture: Secondary | ICD-10-CM | POA: Diagnosis not present

## 2021-09-05 DIAGNOSIS — M25651 Stiffness of right hip, not elsewhere classified: Secondary | ICD-10-CM | POA: Diagnosis not present

## 2021-09-09 DIAGNOSIS — Z1389 Encounter for screening for other disorder: Secondary | ICD-10-CM | POA: Diagnosis not present

## 2021-09-09 DIAGNOSIS — Z0000 Encounter for general adult medical examination without abnormal findings: Secondary | ICD-10-CM | POA: Diagnosis not present

## 2021-09-10 DIAGNOSIS — M25551 Pain in right hip: Secondary | ICD-10-CM | POA: Diagnosis not present

## 2021-09-10 DIAGNOSIS — R293 Abnormal posture: Secondary | ICD-10-CM | POA: Diagnosis not present

## 2021-09-10 DIAGNOSIS — M6281 Muscle weakness (generalized): Secondary | ICD-10-CM | POA: Diagnosis not present

## 2021-09-10 DIAGNOSIS — M25651 Stiffness of right hip, not elsewhere classified: Secondary | ICD-10-CM | POA: Diagnosis not present

## 2021-09-12 DIAGNOSIS — R293 Abnormal posture: Secondary | ICD-10-CM | POA: Diagnosis not present

## 2021-09-12 DIAGNOSIS — M25651 Stiffness of right hip, not elsewhere classified: Secondary | ICD-10-CM | POA: Diagnosis not present

## 2021-09-12 DIAGNOSIS — M6281 Muscle weakness (generalized): Secondary | ICD-10-CM | POA: Diagnosis not present

## 2021-09-12 DIAGNOSIS — M25551 Pain in right hip: Secondary | ICD-10-CM | POA: Diagnosis not present

## 2021-09-16 ENCOUNTER — Other Ambulatory Visit: Payer: Self-pay | Admitting: Family Medicine

## 2021-09-16 DIAGNOSIS — Z1231 Encounter for screening mammogram for malignant neoplasm of breast: Secondary | ICD-10-CM

## 2021-09-16 DIAGNOSIS — E2839 Other primary ovarian failure: Secondary | ICD-10-CM

## 2021-09-18 DIAGNOSIS — M6281 Muscle weakness (generalized): Secondary | ICD-10-CM | POA: Diagnosis not present

## 2021-09-18 DIAGNOSIS — M25651 Stiffness of right hip, not elsewhere classified: Secondary | ICD-10-CM | POA: Diagnosis not present

## 2021-09-18 DIAGNOSIS — R293 Abnormal posture: Secondary | ICD-10-CM | POA: Diagnosis not present

## 2021-09-18 DIAGNOSIS — M25551 Pain in right hip: Secondary | ICD-10-CM | POA: Diagnosis not present

## 2021-09-20 DIAGNOSIS — M6281 Muscle weakness (generalized): Secondary | ICD-10-CM | POA: Diagnosis not present

## 2021-09-20 DIAGNOSIS — R293 Abnormal posture: Secondary | ICD-10-CM | POA: Diagnosis not present

## 2021-09-20 DIAGNOSIS — M25651 Stiffness of right hip, not elsewhere classified: Secondary | ICD-10-CM | POA: Diagnosis not present

## 2021-09-20 DIAGNOSIS — M25551 Pain in right hip: Secondary | ICD-10-CM | POA: Diagnosis not present

## 2021-09-24 DIAGNOSIS — M25551 Pain in right hip: Secondary | ICD-10-CM | POA: Diagnosis not present

## 2021-09-25 DIAGNOSIS — M6281 Muscle weakness (generalized): Secondary | ICD-10-CM | POA: Diagnosis not present

## 2021-09-25 DIAGNOSIS — M25651 Stiffness of right hip, not elsewhere classified: Secondary | ICD-10-CM | POA: Diagnosis not present

## 2021-09-25 DIAGNOSIS — R293 Abnormal posture: Secondary | ICD-10-CM | POA: Diagnosis not present

## 2021-09-25 DIAGNOSIS — M25551 Pain in right hip: Secondary | ICD-10-CM | POA: Diagnosis not present

## 2021-09-27 DIAGNOSIS — M6281 Muscle weakness (generalized): Secondary | ICD-10-CM | POA: Diagnosis not present

## 2021-09-27 DIAGNOSIS — R293 Abnormal posture: Secondary | ICD-10-CM | POA: Diagnosis not present

## 2021-09-27 DIAGNOSIS — M25651 Stiffness of right hip, not elsewhere classified: Secondary | ICD-10-CM | POA: Diagnosis not present

## 2021-09-27 DIAGNOSIS — M25551 Pain in right hip: Secondary | ICD-10-CM | POA: Diagnosis not present

## 2021-10-01 DIAGNOSIS — M25551 Pain in right hip: Secondary | ICD-10-CM | POA: Diagnosis not present

## 2021-10-01 DIAGNOSIS — M6281 Muscle weakness (generalized): Secondary | ICD-10-CM | POA: Diagnosis not present

## 2021-10-01 DIAGNOSIS — M25651 Stiffness of right hip, not elsewhere classified: Secondary | ICD-10-CM | POA: Diagnosis not present

## 2021-10-01 DIAGNOSIS — R293 Abnormal posture: Secondary | ICD-10-CM | POA: Diagnosis not present

## 2021-10-03 DIAGNOSIS — M6281 Muscle weakness (generalized): Secondary | ICD-10-CM | POA: Diagnosis not present

## 2021-10-03 DIAGNOSIS — M25651 Stiffness of right hip, not elsewhere classified: Secondary | ICD-10-CM | POA: Diagnosis not present

## 2021-10-03 DIAGNOSIS — R293 Abnormal posture: Secondary | ICD-10-CM | POA: Diagnosis not present

## 2021-10-03 DIAGNOSIS — M25551 Pain in right hip: Secondary | ICD-10-CM | POA: Diagnosis not present

## 2021-10-08 DIAGNOSIS — N1832 Chronic kidney disease, stage 3b: Secondary | ICD-10-CM | POA: Diagnosis not present

## 2021-10-15 DIAGNOSIS — N1832 Chronic kidney disease, stage 3b: Secondary | ICD-10-CM | POA: Diagnosis not present

## 2021-10-15 DIAGNOSIS — R001 Bradycardia, unspecified: Secondary | ICD-10-CM | POA: Diagnosis not present

## 2021-10-15 DIAGNOSIS — I2510 Atherosclerotic heart disease of native coronary artery without angina pectoris: Secondary | ICD-10-CM | POA: Diagnosis not present

## 2021-10-15 DIAGNOSIS — I129 Hypertensive chronic kidney disease with stage 1 through stage 4 chronic kidney disease, or unspecified chronic kidney disease: Secondary | ICD-10-CM | POA: Diagnosis not present

## 2021-10-15 DIAGNOSIS — E1122 Type 2 diabetes mellitus with diabetic chronic kidney disease: Secondary | ICD-10-CM | POA: Diagnosis not present

## 2021-10-22 DIAGNOSIS — E782 Mixed hyperlipidemia: Secondary | ICD-10-CM | POA: Diagnosis not present

## 2021-10-22 LAB — HEPATIC FUNCTION PANEL
ALT: 14 IU/L (ref 0–32)
AST: 15 IU/L (ref 0–40)
Albumin: 4.8 g/dL (ref 3.8–4.8)
Alkaline Phosphatase: 55 IU/L (ref 44–121)
Bilirubin Total: 0.5 mg/dL (ref 0.0–1.2)
Bilirubin, Direct: 0.11 mg/dL (ref 0.00–0.40)
Total Protein: 5.9 g/dL — ABNORMAL LOW (ref 6.0–8.5)

## 2021-10-22 LAB — LIPID PANEL
Chol/HDL Ratio: 2.3 ratio (ref 0.0–4.4)
Cholesterol, Total: 173 mg/dL (ref 100–199)
HDL: 76 mg/dL (ref >39)
LDL Chol Calc (NIH): 83 mg/dL (ref 0–99)
Triglycerides: 75 mg/dL (ref 0–149)
VLDL Cholesterol Cal: 14 mg/dL (ref 5–40)

## 2021-12-03 ENCOUNTER — Telehealth: Payer: Self-pay

## 2021-12-03 DIAGNOSIS — E782 Mixed hyperlipidemia: Secondary | ICD-10-CM

## 2021-12-03 MED ORDER — ATORVASTATIN CALCIUM 80 MG PO TABS: 80.0000 mg | ORAL_TABLET | Freq: Every day | ORAL | 3 refills | Status: DC

## 2021-12-03 NOTE — Telephone Encounter (Addendum)
-----   Message from Lorretta Harp, MD sent at 11/21/2021  3:49 PM EDT -----  Increase atorvastatin to 80 mg a day and recheck FLP in 3 months.  ----- Message ----- From: Beatrix Fetters, RN Sent: 11/21/2021  10:28 AM EDT To: Lorretta Harp, MD  Pt is currently on atorvastatin 40mg  daily. With improvement of LDL from 90 down to 83. Ok to stay on this? Or please advise. Thank you!

## 2021-12-03 NOTE — Telephone Encounter (Signed)
Spoke to pt regarding Dr. Kennon Holter recommendations. Pt will increase her atorvastatin to 80mg  once daily and we will recheck fasting cholesterol levels in 3 months. New prescription set to pt's pharmacy of choice. Lab orders placed and mailed to pt's home address. Pt verbalizes understanding. While on the phone with pt annual office visit with Dr. Gwenlyn Found made.

## 2021-12-06 NOTE — Progress Notes (Signed)
[facility] Note  12/11/2021     CHIEF COMPLAINT Patient presents for Retina Follow Up    HISTORY OF PRESENT ILLNESS: Kimberly Rivers is a 71 y.o. female who presents to the clinic today for:   HPI     Retina Follow Up   Patient presents with  Dry AMD.  In both eyes.  This started 8 months ago.  I, the attending physician,  performed the HPI with the patient and updated documentation appropriately.        Comments   Patient here for 8 months retina follow up for non exu ARMD OU. Patient states vision doing good. Had cataract surgery OD in April. No eye pain.       Last edited by Bernarda Caffey, MD on 12/11/2021  4:55 PM.    Pt had cataract sx at the end of April with Dr. Eulas Post  Referring physician: Marda Stalker, [place],  [place] [address]  HISTORICAL INFORMATION:   Selected notes from the MEDICAL RECORD NUMBER Referred by [facility] for DM exam LEE: 06.17.19 (Dr. Nada Libman, MD) [BCVA: OD: 20/25 OS: 20/30--]  Ocular Hx-non-exu ARMD, cataract OS, pseudo OD PMH-DM, CKD, HTN    CURRENT MEDICATIONS: Current Outpatient Medications (Ophthalmic Drugs)  Medication Sig   Carboxymethylcellulose Sodium (EYE DROPS OP) Apply to eye. Refresh Drops as needed before bed   Carboxymethylcellulose Sodium (EYE DROPS OP) Apply to eye. Theratears Drops as needed in the mornings.   No current facility-administered medications for this visit. (Ophthalmic Drugs)   Current Outpatient Medications (Other)  Medication Sig   aspirin EC 81 MG tablet Take 81 mg by mouth daily.   atorvastatin (LIPITOR) 80 MG tablet Take 1 tablet (80 mg total) by mouth daily.   citalopram (CELEXA) 20 MG tablet Take 20 mg by mouth daily.   CRANBERRY PO Take 4,200 capsules by mouth daily.   enalapril (VASOTEC) 5 MG tablet Take 1 tablet by mouth once daily   Ergocalciferol (VITAMIN D2 PO) Take by mouth. 2-3 times a week   Ferrous Sulfate (IRON)  325 (65 Fe) MG TABS Take 1 tablet by mouth daily.   gabapentin (NEURONTIN) 300 MG capsule Take 300 mg by mouth at bedtime.    Lactobacillus (PROBIOTIC ACIDOPHILUS PO) Take by mouth.   lubiprostone (AMITIZA) 24 MCG capsule Take 24 mcg by mouth 2 (two) times daily.   Multiple Vitamins-Minerals (PRESERVISION AREDS PO) Take by mouth. Uses 2 times daily   NIFEdipine (PROCARDIA XL/NIFEDICAL XL) 60 MG 24 hr tablet Take 1 tablet (60 mg total) by mouth daily.   oxybutynin (DITROPAN-XL) 5 MG 24 hr tablet Take 5 mg by mouth at bedtime.   repaglinide (PRANDIN) 0.5 MG tablet    vitamin C (ASCORBIC ACID) 500 MG tablet Take 500 mg by mouth daily.   No current facility-administered medications for this visit. (Other)   REVIEW OF SYSTEMS: ROS   Positive for: Endocrine, Cardiovascular, Eyes Negative for: Constitutional, Gastrointestinal, Neurological, Skin, Genitourinary, Musculoskeletal, HENT, Respiratory, Psychiatric, Allergic/Imm, Heme/Lymph Last edited by Theodore Demark, COA on 12/11/2021 12:55 PM.     ALLERGIES Allergies  Allergen Reactions   Clindamycin/Lincomycin    Doxycycline    Penicillins     PAST MEDICAL HISTORY Past Medical History:  Diagnosis Date   Cataract    OD   Chronic kidney disease    Diabetes mellitus without complication ([place])    Hypertension    Hypertensive retinopathy  OU   Macular degeneration    Dry OU   Past Surgical History:  Procedure Laterality Date   CATARACT EXTRACTION Left    EYE SURGERY Left    Cat Sx   FAMILY HISTORY Family History  Problem Relation Age of Onset   Hypertension Mother    Heart disease Mother    Diabetes Mother    Canavan disease Father    Heart disease Maternal Grandmother    Heart disease Maternal Grandfather    Kidney failure Maternal Grandfather    SOCIAL HISTORY Social History   Tobacco Use   Smoking status: Never   Smokeless tobacco: Never  Vaping Use   Vaping Use: Never used  Substance Use Topics   Alcohol  use: Not Currently   Drug use: Never       OPHTHALMIC EXAM:  Base Eye Exam     Visual Acuity (Snellen - Linear)       Right Left   Dist cc 20/30 20/30   Dist ph cc NI 20/30 +1    Correction: Glasses         Tonometry (Tonopen, 12:53 PM)       Right Left   Pressure 13 12         Pupils       Dark Light Shape React APD   Right 3 2 Round Brisk None   Left 3 2 Round Brisk None         Visual Fields (Counting fingers)       Left Right    Full Full         Extraocular Movement       Right Left    Full, Ortho Full, Ortho         Neuro/Psych     Oriented x3: Yes   Mood/Affect: Normal         Dilation     Both eyes: 1.0% Mydriacyl, 2.5% Phenylephrine @ 12:52 PM           Slit Lamp and Fundus Exam     Slit Lamp Exam       Right Left   Lids/Lashes Dermatochalasis - upper lid Dermatochalasis - upper lid   Conjunctiva/Sclera Mild Conjunctivochalasis inferiorly White and quiet   Cornea Arcus, trace Punctate epithelial erosions, well healed cataract wound Arcus, trace PEE, well healed cataract wound   Anterior Chamber deep and clear deep and clear   Iris Round and dilated, No NVI Round and dilated, No NVI   Lens PC IOL in good position PC IOL in good position with PC opening   Anterior Vitreous Mild syneresis, Posterior vitreous detachment, vitreous condensations Vitreous syneresis, Posterior vitreous detachment         Fundus Exam       Right Left   Disc Compact, mild Peripapillary atrophy, mild pallor, Sharp rim Pink and Sharp, tilted, temporal PPA   C/D Ratio 0.2 0.3   Macula Flat, Blunted foveal reflex, RPE mottling and clumping, Drusen, focal atrophy nasal to fovea, no heme, +PED Flat, good foveal reflex, fine drusen, mild Retinal pigment epithelial mottling, No heme or edema   Vessels attenuated, Tortuous attenuated, Tortuous   Periphery Attached, No heme Attached, No heme, peripheral cystoid degeneration           Refraction      Wearing Rx       Sphere Cylinder Axis Add   Right -2.00 +0.75 175 +2.75   Left -0.25 +1.00 105 +2.75  IMAGING AND PROCEDURES  Imaging and Procedures for @TODAY @  OCT, Retina - OU - Both Eyes       Right Eye Quality was good. Central Foveal Thickness: 297. Progression has been stable. Findings include normal foveal contour, no IRF, no SRF, retinal drusen , pigment epithelial detachment, outer retinal atrophy (Persistent central PED with adjacent ORA, no significant change from prior).   Left Eye Quality was good. Central Foveal Thickness: 277. Progression has been stable. Findings include normal foveal contour, no IRF, no SRF, retinal drusen (Very shallow peripheral schisis temporal periphery caught on widefield).   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 CME - cystoid macular edema. PED - pigment epithelial detachment. IRF - intraretinal fluid. SRF - subretinal fluid. EZ - ellipsoid zone. ERM - epiretinal membrane. ORA - outer retinal atrophy. ORT - outer retinal tubulation. SRHM - subretinal hyper-reflective material            ASSESSMENT/PLAN:    ICD-10-CM   1. Diabetes mellitus type 2 without retinopathy ([place])  E11.9 OCT, Retina - OU - Both Eyes    2. Intermediate stage nonexudative age-related macular degeneration of both eyes  H35.3132     3. 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 Essential hypertension  I10     6. Hypertensive retinopathy of both eyes  H35.033     7. Pseudophakia, both eyes  Z96.1      1. Diabetes mellitus, type 2 without retinopathy, OU  - stable  - The incidence,  risk factors for progression, natural history and treatment options for diabetic retinopathy  were discussed with patient.    - The need for close monitoring of blood glucose, blood pressure, and serum lipids, avoiding cigarette or any type of tobacco, and the need for long term follow up was also discussed with patient.  - monitor  2. Age related macular degeneration, non-exudative, both eyes -- stable  - The incidence, anatomy, and pathology of dry AMD, risk of progression, and the AREDS and AREDS 2 study including smoking risks discussed with patient.  - OCT and exam show progression of PED OD, will f/u in 6-9 months  - continue amsler grid monitoring  - continue AREDS 2 supplements  - f/u 6-9 months, DFE, OCT  3. Myopic degeneration OD  - mild low lying PED w/ overlying ORA nasal to fovea  - stable without IRF/SRF  - monitor  4. Shallow peripheral schisis OS  - OCT with shallow schisis temporal periphery caught on widefield -- stable  - monitor  5,6. Hypertensive retinopathy OU  - discussed importance of tight BP control  - monitor  7. Pseudophakia OU  - s/p CE/IOL OD (April 2023 with Dr. Eulas Post)  - s/p CE/IOL and YAG cap OS  - beautiful surgeries, doing well  - surgery done around 2017, Dr. Caleen Essex. Ernst Spell in [place]  - monitor  Ophthalmic Meds Ordered this visit:  No orders of the defined types were placed in this encounter.   Return for f/u 6-9 months, non-exu ARMD OU, DFE, OCT.  There are no Patient Instructions on file for this visit.  This document serves as a record of services personally performed by Gardiner Sleeper, MD, PhD. It was created on their behalf by Orvan Falconer, an ophthalmic technician. The creation of this record is the provider's dictation and/or activities during the visit.    Electronically signed by: Orvan Falconer, OA, 12/11/21  5:18 PM   This document serves as a record of services personally performed by Gardiner Sleeper, MD, PhD. It  was created on their behalf by San Jetty. Owens Shark, OA an ophthalmic technician. The creation of this record is the provider's dictation and/or activities during the visit.    Electronically signed by: San Jetty. Owens Shark, [place] 09.27.2023 5:18 PM   Gardiner Sleeper, M.D., Ph.D. Diseases & Surgery of the Retina and Vitreous [facility]  I have reviewed the above documentation for accuracy and completeness, and I agree with the above. Gardiner Sleeper, M.D., Ph.D. 12/11/21 5:18 PM   Abbreviations: M myopia (nearsighted); A astigmatism; H hyperopia (farsighted); P presbyopia; Mrx spectacle prescription;  CTL contact lenses; OD right eye; OS left eye; OU both eyes  XT exotropia; ET esotropia; PEK punctate epithelial keratitis; PEE punctate epithelial erosions; DES dry eye syndrome; MGD meibomian gland dysfunction; ATs artificial tears; PFAT's preservative free artificial tears; [place] nuclear sclerotic cataract; PSC posterior subcapsular cataract; ERM epi-retinal membrane; PVD posterior vitreous detachment; RD retinal detachment; DM diabetes mellitus; DR diabetic retinopathy; NPDR non-proliferative diabetic retinopathy; PDR proliferative diabetic retinopathy; CSME clinically significant macular edema; DME diabetic macular edema; dbh dot blot hemorrhages; CWS cotton wool spot; POAG primary open angle glaucoma; C/D cup-to-disc ratio; HVF humphrey visual field; GVF goldmann visual field; OCT optical coherence tomography; IOP intraocular pressure; BRVO Branch retinal vein occlusion; CRVO central retinal vein occlusion; CRAO central retinal artery occlusion; BRAO branch retinal artery occlusion; RT retinal tear; SB scleral buckle; PPV pars plana vitrectomy; VH Vitreous hemorrhage; PRP panretinal laser photocoagulation; IVK intravitreal kenalog; VMT vitreomacular traction; MH Macular hole;  NVD neovascularization of the disc; NVE neovascularization elsewhere; AREDS age related eye disease study; ARMD age  related macular degeneration; POAG primary open angle glaucoma; EBMD epithelial/anterior basement membrane dystrophy; ACIOL anterior chamber intraocular lens; IOL intraocular lens; PCIOL posterior chamber intraocular lens; Phaco/IOL phacoemulsification with intraocular lens placement; [place] photorefractive keratectomy; LASIK laser assisted in situ keratomileusis; HTN hypertension; DM diabetes mellitus; COPD chronic obstructive pulmonary disease

## 2021-12-11 ENCOUNTER — Ambulatory Visit (INDEPENDENT_AMBULATORY_CARE_PROVIDER_SITE_OTHER): Payer: PPO | Admitting: Ophthalmology

## 2021-12-11 ENCOUNTER — Encounter (INDEPENDENT_AMBULATORY_CARE_PROVIDER_SITE_OTHER): Payer: Self-pay | Admitting: Ophthalmology

## 2021-12-11 DIAGNOSIS — H353132 Nonexudative age-related macular degeneration, bilateral, intermediate dry stage: Secondary | ICD-10-CM

## 2021-12-11 DIAGNOSIS — H4421 Degenerative myopia, right eye: Secondary | ICD-10-CM

## 2021-12-11 DIAGNOSIS — H25811 Combined forms of age-related cataract, right eye: Secondary | ICD-10-CM

## 2021-12-11 DIAGNOSIS — Z961 Presence of intraocular lens: Secondary | ICD-10-CM

## 2021-12-11 DIAGNOSIS — I10 Essential (primary) hypertension: Secondary | ICD-10-CM

## 2021-12-11 DIAGNOSIS — H33102 Unspecified retinoschisis, left eye: Secondary | ICD-10-CM

## 2021-12-11 DIAGNOSIS — H35033 Hypertensive retinopathy, bilateral: Secondary | ICD-10-CM | POA: Diagnosis not present

## 2021-12-11 DIAGNOSIS — E119 Type 2 diabetes mellitus without complications: Secondary | ICD-10-CM

## 2021-12-20 IMAGING — CR DG KNEE COMPLETE 4+V*R*
4 series · 4 of 4 positions shown · non-contrast
Comparison: None.

CLINICAL DATA: 69-year-old female with right knee pain.

EXAM:
RIGHT KNEE - COMPLETE 4+ VIEW

[t knee ap right]
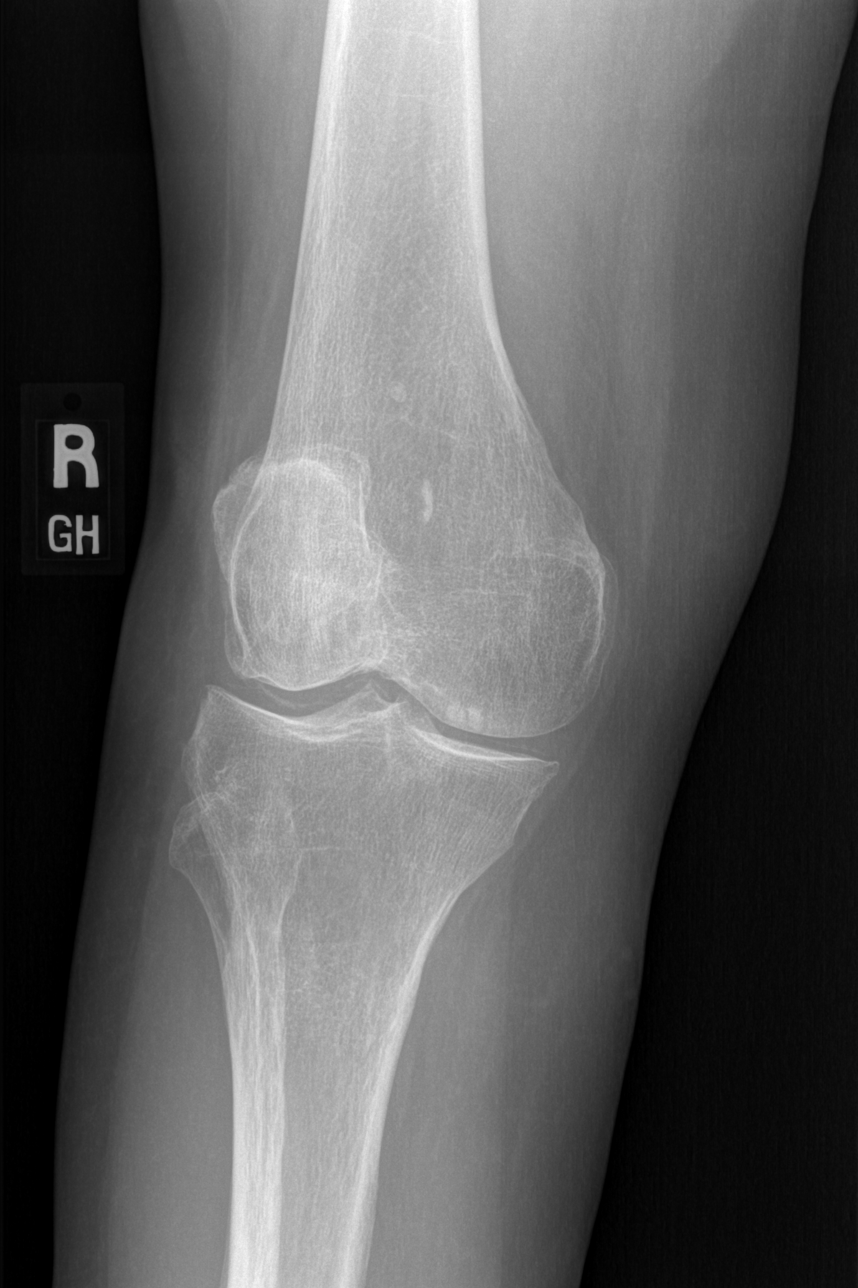

[t knee oblique right (1 of 2)]
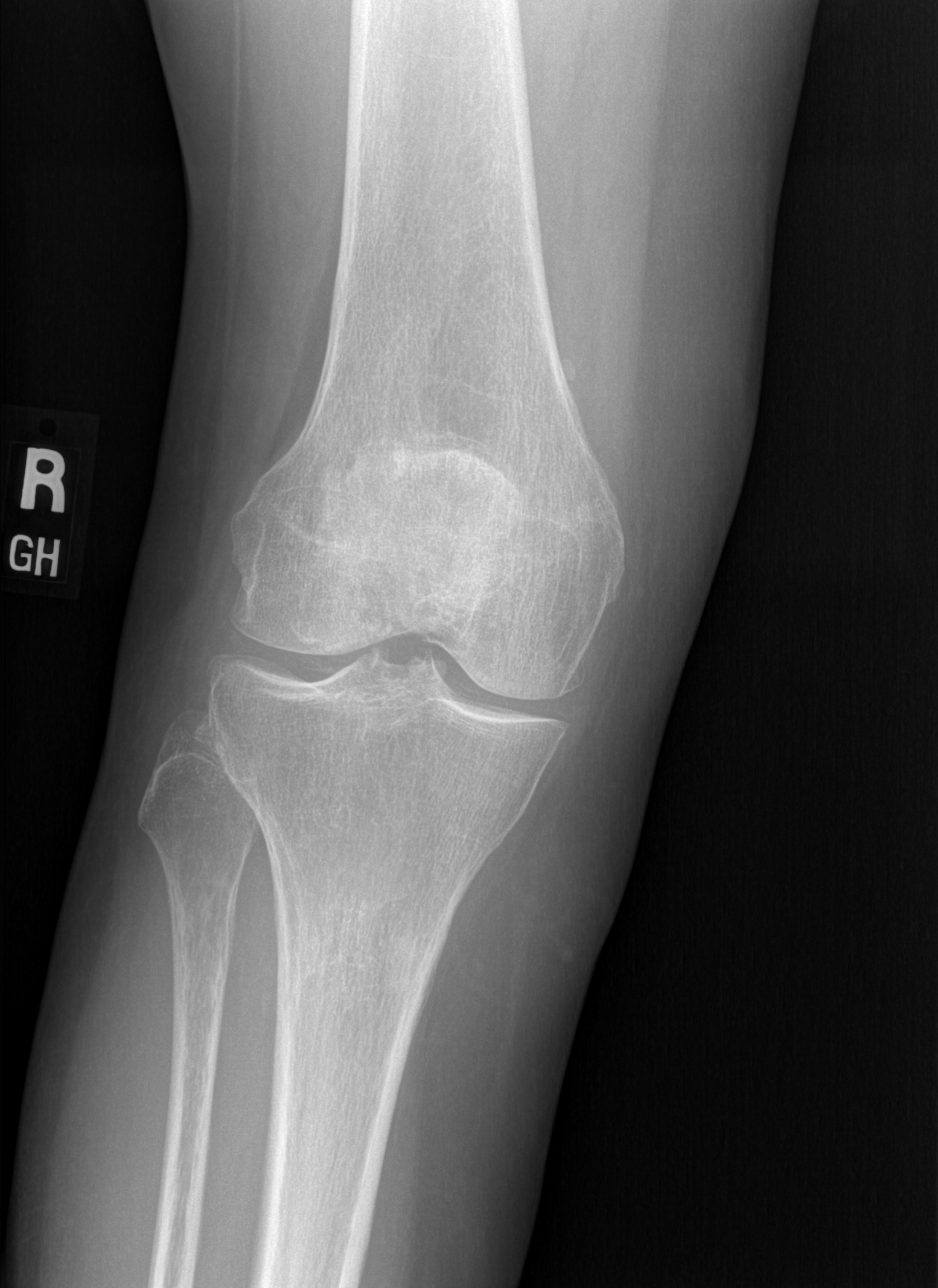

[t knee oblique right (2 of 2)]
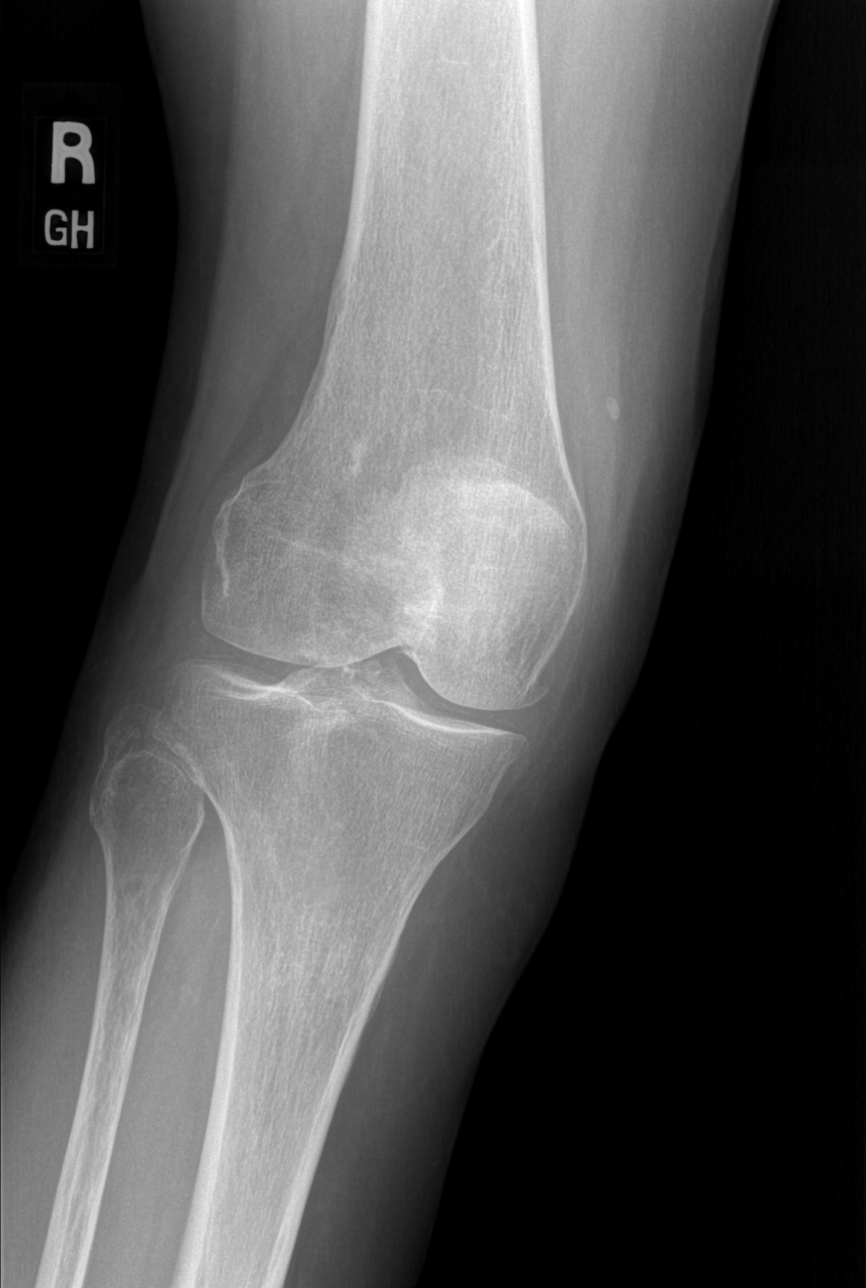

[t knee lat right]
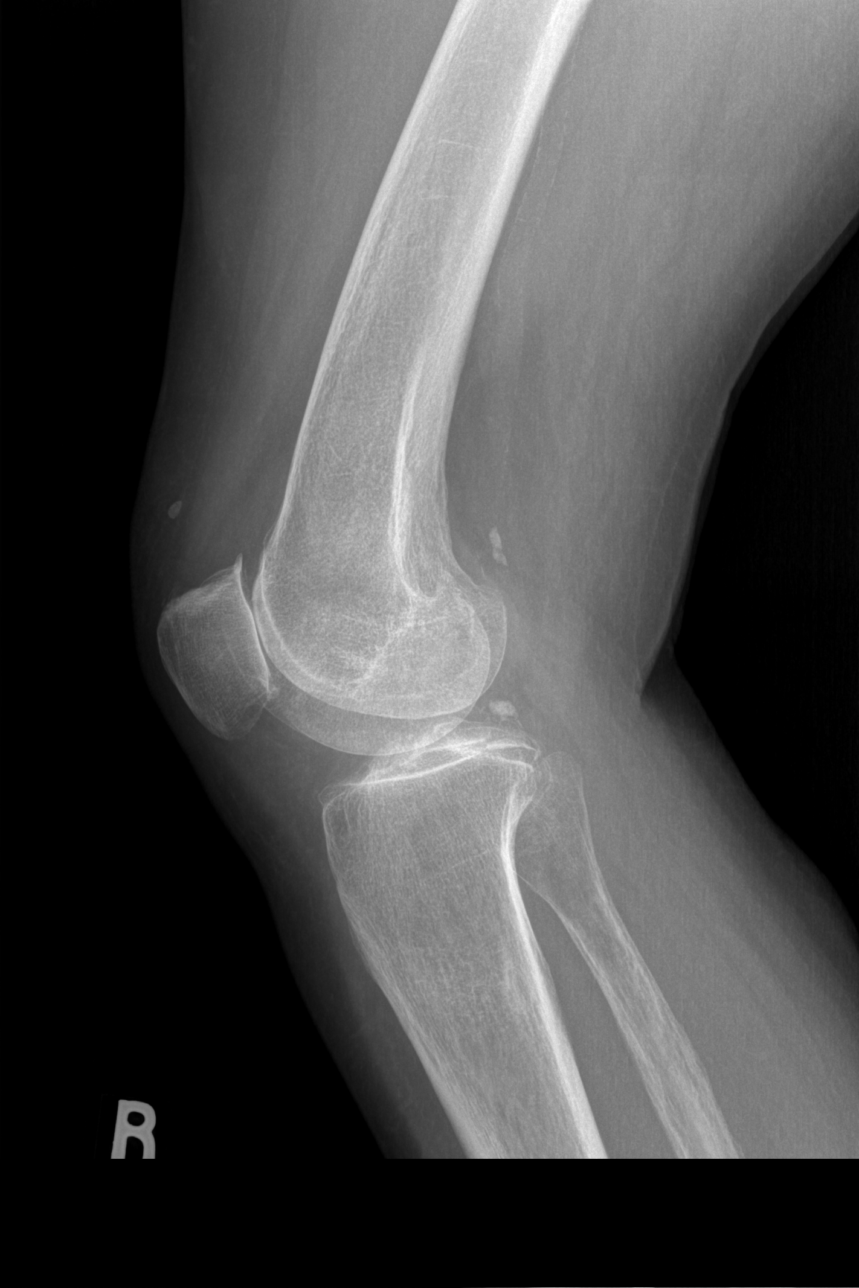

[4 of 4 positions shown; findings below may reference images not displayed]

FINDINGS: There is no acute fracture or dislocation. The bones are osteopenic.
There is moderate arthritic changes with tricompartmental narrowing.
There is meniscal chondrocalcinosis. Small suprapatellar effusion.
The soft tissues are unremarkable.
IMPRESSION: 1. No acute fracture or dislocation.
2. Osteopenia with moderate arthritic changes.

## 2022-01-01 ENCOUNTER — Encounter: Payer: Self-pay | Admitting: Cardiovascular Disease

## 2022-01-01 ENCOUNTER — Ambulatory Visit: Payer: PPO | Attending: Cardiovascular Disease | Admitting: Cardiovascular Disease

## 2022-01-01 DIAGNOSIS — I6522 Occlusion and stenosis of left carotid artery: Secondary | ICD-10-CM | POA: Diagnosis not present

## 2022-01-01 DIAGNOSIS — I2510 Atherosclerotic heart disease of native coronary artery without angina pectoris: Secondary | ICD-10-CM | POA: Diagnosis not present

## 2022-01-01 DIAGNOSIS — I10 Essential (primary) hypertension: Secondary | ICD-10-CM

## 2022-01-01 DIAGNOSIS — I779 Disorder of arteries and arterioles, unspecified: Secondary | ICD-10-CM | POA: Insufficient documentation

## 2022-01-01 DIAGNOSIS — E782 Mixed hyperlipidemia: Secondary | ICD-10-CM | POA: Diagnosis not present

## 2022-01-01 NOTE — Assessment & Plan Note (Signed)
History of essential hypertension blood pressure measured today at 122/52.  She is on enalapril and Procardia.

## 2022-01-01 NOTE — Assessment & Plan Note (Signed)
Moderate left ICA stenosis by duplex ultrasound performed 01/11/2021.  This will be repeated on an annual basis.

## 2022-01-01 NOTE — Patient Instructions (Signed)
Medication Instructions:  Your physician recommends that you continue on your current medications as directed. Please refer to the Current Medication list given to you today.  *If you need a refill on your cardiac medications before your next appointment, please call your pharmacy*   Testing/Procedures: Your physician has requested that you have a carotid duplex. This test is an ultrasound of the carotid arteries in your neck. It looks at blood flow through these arteries that supply the brain with blood. Allow one hour for this exam. There are no restrictions or special instructions. This procedure will be done at [address]   Follow-Up: At [facility], you and your health needs are our priority.  As part of our continuing mission to provide you with exceptional heart care, we have created designated Provider Care Teams.  These Care Teams include your primary Cardiologist (physician) and Advanced Practice Providers (APPs -  Physician Assistants and Nurse Practitioners) who all work together to provide you with the care you need, when you need it.  We recommend signing up for the patient portal called "MyChart".  Sign up information is provided on this After Visit Summary.  MyChart is used to connect with patients for Virtual Visits (Telemedicine).  Patients are able to view lab/test results, encounter notes, upcoming appointments, etc.  Non-urgent messages can be sent to your provider as well.   To learn more about what you can do with MyChart, go to [email or web address].    Your next appointment:   12 month(s)  The format for your next appointment:   In Person  Provider:   Jonathan Berry, MD  

## 2022-01-01 NOTE — Progress Notes (Signed)
01/01/2022 Kimberly Rivers   Aug 04, 1950  [id number]  Primary Physician Kimberly Stalker, PA-C Primary Cardiologist: Kimberly Harp MD Kimberly Rivers, [place]  HPI:  Kimberly Rivers is a 71 y.o.   mildly overweight single Caucasian female with no children who relocated from the [place] to [facility] because of retirement and cost of living.  She was referred by Dr. Lynelle Rivers, her PCP, to be established in my practice for ongoing cardiovascular care.  I last saw her in the office 01/02/2021. 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  Risk factors include treated hypertension, diabetes and lipidemia.  Her mother did have a CABG.  She is never had a heart attack or stroke.  She had stenting at [facility] before 2010 with a Taxus express drug-eluting stent and then stenting 2/29/2010 with 2 Endeavor stents in her LAD.  She had a normal 2D echo and Myoview stress test by her cardiologist in [place] 08/26/2017.    Since I saw her a year and a half ago she continues to do well.  She denies chest pain or shortness of breath.   Current Meds  Medication Sig   aspirin EC 81 MG tablet Take 81 mg by mouth daily.   atorvastatin (LIPITOR) 80 MG tablet Take 1 tablet (80 mg total) by mouth daily.   Carboxymethylcellulose Sodium (EYE DROPS OP) Apply to eye. Refresh Drops as needed before bed   citalopram (CELEXA) 20 MG tablet Take 20 mg by mouth daily.   CRANBERRY PO Take 4,200 capsules by mouth daily.   enalapril (VASOTEC) 5 MG tablet Take 1 tablet by mouth once daily   Ergocalciferol (VITAMIN D2 PO) Take by mouth. 2-3 times a week   Ferrous Sulfate (IRON) 325 (65 Fe) MG TABS Take 1 tablet by mouth daily.   gabapentin (NEURONTIN) 300 MG capsule Take 300 mg by mouth at bedtime.    Lactobacillus (PROBIOTIC ACIDOPHILUS PO) Take by mouth.   lubiprostone (AMITIZA) 24 MCG capsule Take 24 mcg by mouth 2 (two) times daily.   NIFEdipine (PROCARDIA XL/NIFEDICAL XL) 60 MG 24  hr tablet Take 1 tablet (60 mg total) by mouth daily.   oxybutynin (DITROPAN-XL) 5 MG 24 hr tablet Take 5 mg by mouth at bedtime.   repaglinide (PRANDIN) 0.5 MG tablet    vitamin C (ASCORBIC ACID) 500 MG tablet Take 500 mg by mouth daily.     Allergies  Allergen Reactions   Clindamycin/Lincomycin    Doxycycline    Penicillins     Social History   Socioeconomic History   Marital status: Single    Spouse name: Not on file   Number of children: Not on file   Years of education: Not on file   Highest education level: Not on file  Occupational History   Not on file  Tobacco Use   Smoking status: Never   Smokeless tobacco: Never  Vaping Use   Vaping Use: Never used  Substance and Sexual Activity   Alcohol use: Not Currently   Drug use: Never   Sexual activity: Not Currently  Other Topics Concern   Not on file  Social History Narrative   Not on file   Social Determinants of Health   Financial Resource Strain: Not on file  Food Insecurity: Not on file  Transportation Needs: Not on file  Physical Activity: Not on file  Stress: Not on file  Social Connections: Not on file  Intimate  Partner Violence: Not on file     Review of Systems: General: negative for chills, fever, night sweats or weight changes.  Cardiovascular: negative for chest pain, dyspnea on exertion, edema, orthopnea, palpitations, paroxysmal nocturnal dyspnea or shortness of breath Dermatological: negative for rash Respiratory: negative for cough or wheezing Urologic: negative for hematuria Abdominal: negative for nausea, vomiting, diarrhea, bright red blood per rectum, melena, or hematemesis Neurologic: negative for visual changes, syncope, or dizziness All other systems reviewed and are otherwise negative except as noted above.    Blood pressure (!) 122/52, pulse (!) 48, height 4\' 11"  (1.499 m), weight 120 lb 12.8 oz (54.8 kg), SpO2 100 %.  General appearance: alert and no distress Neck: no  adenopathy, no carotid bruit, no JVD, supple, symmetrical, trachea midline, and thyroid not enlarged, symmetric, no tenderness/mass/nodules Lungs: clear to auscultation bilaterally Heart: 2/6 outflow tract murmur Extremities: extremities normal, atraumatic, no cyanosis or edema Pulses: 2+ and symmetric Skin: Skin color, texture, turgor normal. No rashes or lesions Neurologic: Grossly normal  EKG sinus bradycardia at 48 with right bundle branch block unchanged from prior EKGs.  I personally reviewed this EKG.  ASSESSMENT AND PLAN:   Essential hypertension History of essential hypertension blood pressure measured today at 122/52.  She is on enalapril and Procardia.  Hyperlipidemia History of hyperlipidemia on high-dose statin therapy with lipid profile performed 10/22/2021 revealing total cholesterol 173, LDL of 83 and HDL of 76.  Coronary artery disease History of CAD status post stenting at [facility] in 2010 with Taxus drug-eluting stents.  She had stenting 2/29/2010 with 2 Endeavor stents to her LAD.  She had a Myoview stress test performed in the [place] 08/26/2017 which was nonischemic.  She denies chest pain or shortness of breath.  Carotid artery disease (HCC) Moderate left ICA stenosis by duplex ultrasound performed 01/11/2021.  This will be repeated on an annual basis.     Kimberly Harp MD FACP,FACC,FAHA, [facility] 01/01/2022 10:23 AM

## 2022-01-01 NOTE — Assessment & Plan Note (Signed)
History of hyperlipidemia on high-dose statin therapy with lipid profile performed 10/22/2021 revealing total cholesterol 173, LDL of 83 and HDL of 76.

## 2022-01-01 NOTE — Assessment & Plan Note (Signed)
History of CAD status post stenting at [facility] in 2010 with Taxus drug-eluting stents.  She had stenting 2/29/2010 with 2 Endeavor stents to her LAD.  She had a Myoview stress test performed in the [facility] 08/26/2017 which was nonischemic.  She denies chest pain or shortness of breath.

## 2022-01-09 ENCOUNTER — Ambulatory Visit (HOSPITAL_COMMUNITY)
Admission: RE | Admit: 2022-01-09 | Discharge: 2022-01-09 | Disposition: A | Discharge status: Home / Self Care | Payer: PPO | Source: Ambulatory Visit | Attending: Cardiovascular Disease | Admitting: Cardiovascular Disease

## 2022-01-09 ENCOUNTER — Other Ambulatory Visit: Payer: Self-pay | Admitting: Cardiovascular Disease

## 2022-01-09 DIAGNOSIS — I6523 Occlusion and stenosis of bilateral carotid arteries: Secondary | ICD-10-CM

## 2022-02-23 ENCOUNTER — Other Ambulatory Visit: Payer: Self-pay | Admitting: Cardiovascular Disease

## 2022-02-25 ENCOUNTER — Encounter: Payer: Self-pay | Admitting: Cardiovascular Disease

## 2022-02-25 MED ORDER — ENALAPRIL MALEATE 5 MG PO TABS: 5.0000 mg | ORAL_TABLET | Freq: Every day | ORAL | 3 refills | Status: DC

## 2022-02-27 ENCOUNTER — Ambulatory Visit
Admission: RE | Admit: 2022-02-27 | Discharge: 2022-02-27 | Disposition: A | Discharge status: Home / Self Care | Payer: PPO | Source: Ambulatory Visit | Attending: Family Medicine | Admitting: Family Medicine

## 2022-02-27 ENCOUNTER — Ambulatory Visit: Payer: PPO

## 2022-02-27 DIAGNOSIS — M85852 Other specified disorders of bone density and structure, left thigh: Secondary | ICD-10-CM | POA: Diagnosis not present

## 2022-02-27 DIAGNOSIS — Z1231 Encounter for screening mammogram for malignant neoplasm of breast: Secondary | ICD-10-CM | POA: Diagnosis not present

## 2022-02-27 DIAGNOSIS — E2839 Other primary ovarian failure: Secondary | ICD-10-CM

## 2022-02-27 DIAGNOSIS — Z780 Asymptomatic menopausal state: Secondary | ICD-10-CM | POA: Diagnosis not present

## 2022-03-05 DIAGNOSIS — E782 Mixed hyperlipidemia: Secondary | ICD-10-CM | POA: Diagnosis not present

## 2022-03-05 LAB — HEPATIC FUNCTION PANEL
ALT: 14 IU/L (ref 0–32)
AST: 15 IU/L (ref 0–40)
Albumin: 4.6 g/dL (ref 3.8–4.8)
Alkaline Phosphatase: 50 IU/L (ref 44–121)
Bilirubin Total: 0.4 mg/dL (ref 0.0–1.2)
Bilirubin, Direct: 0.16 mg/dL (ref 0.00–0.40)
Total Protein: 6 g/dL (ref 6.0–8.5)

## 2022-03-05 LAB — LIPID PANEL
Chol/HDL Ratio: 1.9 ratio (ref 0.0–4.4)
Cholesterol, Total: 152 mg/dL (ref 100–199)
HDL: 81 mg/dL (ref >39)
LDL Chol Calc (NIH): 60 mg/dL (ref 0–99)
Triglycerides: 49 mg/dL (ref 0–149)
VLDL Cholesterol Cal: 11 mg/dL (ref 5–40)

## 2022-04-08 DIAGNOSIS — I10 Essential (primary) hypertension: Secondary | ICD-10-CM | POA: Diagnosis not present

## 2022-04-08 DIAGNOSIS — E785 Hyperlipidemia, unspecified: Secondary | ICD-10-CM | POA: Diagnosis not present

## 2022-04-08 DIAGNOSIS — K219 Gastro-esophageal reflux disease without esophagitis: Secondary | ICD-10-CM | POA: Diagnosis not present

## 2022-04-08 DIAGNOSIS — I2510 Atherosclerotic heart disease of native coronary artery without angina pectoris: Secondary | ICD-10-CM | POA: Diagnosis not present

## 2022-04-08 DIAGNOSIS — F3341 Major depressive disorder, recurrent, in partial remission: Secondary | ICD-10-CM | POA: Diagnosis not present

## 2022-04-08 DIAGNOSIS — E1169 Type 2 diabetes mellitus with other specified complication: Secondary | ICD-10-CM | POA: Diagnosis not present

## 2022-04-16 DIAGNOSIS — I10 Essential (primary) hypertension: Secondary | ICD-10-CM | POA: Diagnosis not present

## 2022-04-16 DIAGNOSIS — N3281 Overactive bladder: Secondary | ICD-10-CM | POA: Diagnosis not present

## 2022-04-16 DIAGNOSIS — E1169 Type 2 diabetes mellitus with other specified complication: Secondary | ICD-10-CM | POA: Diagnosis not present

## 2022-04-16 DIAGNOSIS — E1122 Type 2 diabetes mellitus with diabetic chronic kidney disease: Secondary | ICD-10-CM | POA: Diagnosis not present

## 2022-04-16 DIAGNOSIS — Z6824 Body mass index (BMI) 24.0-24.9, adult: Secondary | ICD-10-CM | POA: Diagnosis not present

## 2022-04-16 DIAGNOSIS — N1832 Chronic kidney disease, stage 3b: Secondary | ICD-10-CM | POA: Diagnosis not present

## 2022-04-16 DIAGNOSIS — R0989 Other specified symptoms and signs involving the circulatory and respiratory systems: Secondary | ICD-10-CM | POA: Diagnosis not present

## 2022-04-16 DIAGNOSIS — F3341 Major depressive disorder, recurrent, in partial remission: Secondary | ICD-10-CM | POA: Diagnosis not present

## 2022-04-16 DIAGNOSIS — B359 Dermatophytosis, unspecified: Secondary | ICD-10-CM | POA: Diagnosis not present

## 2022-04-16 DIAGNOSIS — Z9861 Coronary angioplasty status: Secondary | ICD-10-CM | POA: Diagnosis not present

## 2022-04-16 DIAGNOSIS — I2510 Atherosclerotic heart disease of native coronary artery without angina pectoris: Secondary | ICD-10-CM | POA: Diagnosis not present

## 2022-04-16 DIAGNOSIS — E785 Hyperlipidemia, unspecified: Secondary | ICD-10-CM | POA: Diagnosis not present

## 2022-04-22 ENCOUNTER — Ambulatory Visit (HOSPITAL_COMMUNITY)
Admission: RE | Admit: 2022-04-22 | Discharge: 2022-04-22 | Disposition: A | Discharge status: Home / Self Care | Payer: PPO | Source: Ambulatory Visit | Attending: Cardiology | Admitting: Cardiology

## 2022-04-22 ENCOUNTER — Other Ambulatory Visit (HOSPITAL_COMMUNITY): Payer: Self-pay | Admitting: Family Medicine

## 2022-04-22 ENCOUNTER — Encounter (HOSPITAL_COMMUNITY): Payer: Self-pay | Admitting: Family Medicine

## 2022-04-22 DIAGNOSIS — R0989 Other specified symptoms and signs involving the circulatory and respiratory systems: Secondary | ICD-10-CM | POA: Diagnosis not present

## 2022-05-06 DIAGNOSIS — B359 Dermatophytosis, unspecified: Secondary | ICD-10-CM | POA: Diagnosis not present

## 2022-06-02 ENCOUNTER — Ambulatory Visit (INDEPENDENT_AMBULATORY_CARE_PROVIDER_SITE_OTHER): Payer: PPO | Admitting: Dermatology

## 2022-06-02 VITALS — BP 115/68

## 2022-06-02 DIAGNOSIS — R21 Rash and other nonspecific skin eruption: Secondary | ICD-10-CM | POA: Diagnosis not present

## 2022-06-02 DIAGNOSIS — L821 Other seborrheic keratosis: Secondary | ICD-10-CM | POA: Diagnosis not present

## 2022-06-02 MED ORDER — TRIAMCINOLONE ACETONIDE 0.1 % EX OINT: 1.0000 | TOPICAL_OINTMENT | Freq: Two times a day (BID) | CUTANEOUS | 0 refills | Status: DC

## 2022-06-02 NOTE — Patient Instructions (Addendum)
Apply Terbinafine then Triamcinolone cream twice daily for 2 weeks then stop. Wait 1 week. If still persistent, restart for 2 more weeks. If rash is not clear in 1 month, return to clinic for re-evaluation.  Topical steroids (such as triamcinolone, fluocinolone, fluocinonide, mometasone, clobetasol, halobetasol, betamethasone, hydrocortisone) can cause thinning and lightening of the skin if they are used for too long in the same area. Your physician has selected the right strength medicine for your problem and area affected on the body. Please use your medication only as directed by your physician to prevent side effects.    Due to recent changes in healthcare laws, you may see results of your pathology and/or laboratory studies on MyChart before the doctors have had a chance to review them. We understand that in some cases there may be results that are confusing or concerning to you. Please understand that not all results are received at the same time and often the doctors may need to interpret multiple results in order to provide you with the best plan of care or course of treatment. Therefore, we ask that you please give [place] 2 business days to thoroughly review all your results before contacting the office for clarification. Should we see a critical lab result, you will be contacted sooner.   If You Need Anything After Your Visit  If you have any questions or concerns for your doctor, please call our main line at [phone] If no one answers, please leave a voicemail as directed and we will return your call as soon as possible. Messages left after 4 pm will be answered the following business day.   You may also send [place] a message via [place]. We typically respond to MyChart messages within 1-2 business days.  For prescription refills, please ask your pharmacy to contact our office. Our fax number is [phone].  If you have an urgent issue when the clinic is closed that cannot wait until the next  business day, you can page your doctor at the number below.    Please note that while we do our best to be available for urgent issues outside of office hours, we are not available 24/7.   If you have an urgent issue and are unable to reach [place], you may choose to seek medical care at your doctor's office, retail clinic, urgent care center, or emergency room.  If you have a medical emergency, please immediately call 911 or go to the emergency department. In the event of inclement weather, please call our main line at [phone] for an update on the status of any delays or closures.  Dermatology Medication Tips: Please keep the boxes that topical medications come in in order to help keep track of the instructions about where and how to use these. Pharmacies typically print the medication instructions only on the boxes and not directly on the medication tubes.   If your medication is too expensive, please contact our office at [phone] or send [place] a message through [place].   We are unable to tell what your co-pay for medications will be in advance as this is different depending on your insurance coverage. However, we may be able to find a substitute medication at lower cost or fill out paperwork to get insurance to cover a needed medication.   If a prior authorization is required to get your medication covered by your insurance company, please allow [place] 1-2 business days to complete this process.  Drug prices often vary depending on where the  prescription is filled and some pharmacies may offer cheaper prices.  The website www.goodrx.com contains coupons for medications through different pharmacies. The prices here do not account for what the cost may be with help from insurance (it may be cheaper with your insurance), but the website can give you the price if you did not use any insurance.  - You can print the associated coupon and take it with your prescription to the pharmacy.  - You may  also stop by our office during regular business hours and pick up a GoodRx coupon card.  - If you need your prescription sent electronically to a different pharmacy, notify our office through [facility] or by phone at [place] enviarnos un mensaje a travs de MyChart. Por lo general respondemos a los mensajes de MyChart en el transcurso de 1 a 2 das hbiles.  Para renovar recetas, por favor pida a su farmacia que se ponga en contacto con nuestra oficina. Harland Dingwall de fax es [place] [phone].  Si tiene un asunto urgente cuando la clnica est cerrada y que no puede esperar hasta el siguiente da hbil, puede llamar/localizar a su doctor(a) al nmero que aparece a continuacin.   Por favor, tenga en cuenta que aunque hacemos todo lo posible para estar disponibles para asuntos urgentes fuera del horario de [place], no estamos disponibles las 24 horas del da, los 7 das de la [place].   Si tiene un problema urgente y no puede comunicarse con nosotros, puede optar por buscar atencin mdica  en el consultorio de su doctor(a), en una clnica privada, en un centro de atencin urgente o en una sala de emergencias.  Si tiene Engineering geologist, por favor llame inmediatamente al 911 o vaya a la sala de emergencias.  Nmeros de bper  - Dr. Nehemiah Massed: [phone]  - Dra. Moye: [phone]  - Dra. Nicole Kindred: [phone]  En caso de inclemencias del [place], por favor llame a Johnsie Kindred principal al [phone] para una actualizacin sobre el [place] de cualquier retraso o cierre.  Consejos para la medicacin en dermatologa: Por favor, guarde las cajas en las que vienen los medicamentos de uso tpico para ayudarle a seguir las instrucciones sobre dnde y cmo usarlos. Las farmacias generalmente imprimen las instrucciones del medicamento slo en las cajas y no directamente en los tubos del [place].   Si su  medicamento es muy caro, por favor, pngase en contacto con Zigmund Daniel llamando al [phone] y presione la opcin 4 o envenos un mensaje a travs de Pharmacist, community.   No podemos decirle cul ser su copago por los medicamentos por adelantado ya que esto es diferente dependiendo de la cobertura de su seguro. Sin embargo, es posible que podamos encontrar un medicamento sustituto a Electrical engineer un formulario para que el seguro cubra el medicamento que se considera necesario.   Si se requiere una autorizacin previa para que su compaa de seguros [place] su medicamento, por favor permtanos de 1 a 2 das hbiles para completar este proceso.  Los precios de los medicamentos varan con frecuencia dependiendo del Environmental consultant de dnde se surte la receta y alguna farmacias pueden ofrecer precios ms baratos.  El sitio web www.goodrx.com tiene cupones para medicamentos de Airline pilot. Los precios aqu no tienen en cuenta lo que podra costar con la ayuda del seguro (puede ser ms barato con su seguro), pero el sitio web puede darle  el precio si no Field seismologist.  - Puede imprimir el cupn correspondiente y llevarlo con su receta a la farmacia.  - Tambin puede pasar por nuestra oficina durante el horario de atencin regular y Charity fundraiser una tarjeta de cupones de GoodRx.  - Si necesita que su receta se enve electrnicamente a una farmacia diferente, informe a nuestra oficina a travs de MyChart de [place] o por telfono llamando al [phone] y presione la opcin 4.

## 2022-06-02 NOTE — Progress Notes (Unsigned)
   New Patient Visit  Subjective  Kimberly Rivers is a 72 y.o. female who presents for the following: Rash (Right arm x ~ 2 months that is itch - she has tried clotrimazole and terbinafine creams but neither worked. She has another small rash of left lower leg that is not bothersome but she would like it checked.) and Warts (Posterior neck x ~ 2 months - no itching or pain.).    The following portions of the chart were reviewed this encounter and updated as appropriate:   Tobacco  Allergies  Meds  Problems  Med Hx  Surg Hx  Fam Hx      Review of Systems:  No other skin or systemic complaints except as noted in HPI or Assessment and Plan.  Objective  Well appearing patient in no apparent distress; mood and affect are within normal limits.  A focused examination was performed including right arm, left leg and neck. Relevant physical exam findings are noted in the Assessment and Plan.  Post neck Seborrheic Keratoses - Stuck-on, waxy, tan-brown papules and/or plaques  - Benign-appearing - Discussed benign etiology and prognosis. - Observe - Call for any changes       right forearm, left lat lower leg Pink plaques with fine scale.  One on right arm measuring 4-5cm and a small 49mm newer lesion on left lower leg    Assessment & Plan  Seborrheic keratosis Post neck  Benign-appearing.  Observation.  Call clinic for new or changing lesions.  Recommend daily use of broad spectrum spf 30+ sunscreen to sun-exposed areas.    Rash right forearm, left lat lower leg  Differential Dx Include: Nummular eczema vs GA since she had no response to 2 different OTC antifungal creams used for the past 3-4 weeks.    Start Triamcinolone 0.1% cream bid x 2 weeks. Advised patient to apply Terbinafine cream first then apply triamcinolone 0- use for 2 weeks then stop. If not resolved, wait one week then restart for another 2 weeks.  Plan for next visit:  Will plan biopsy on follow up if  no response.    triamcinolone ointment (KENALOG) 0.1 % - right forearm, left lat lower leg Apply 1 Application topically 2 (two) times daily. To rash of right arm as directed. Avoid face, groin, underarms   Return in about 1 month (around 07/03/2022).  I, Ashok Cordia, CMA, am acting as scribe for Ellard Artis, MD .  Documentation: I have reviewed the above documentation for accuracy and completeness, and I agree with the above  [place], DO

## 2022-06-03 ENCOUNTER — Encounter: Payer: Self-pay | Admitting: Dermatology

## 2022-06-19 NOTE — Progress Notes (Signed)
[facility] - Clinic Note  06/25/2022     CHIEF COMPLAINT Patient presents for Retina Follow Up    HISTORY OF PRESENT ILLNESS: Kimberly Rivers is a 72 y.o. female who presents to the clinic today for:   HPI     Retina Follow Up   Patient presents with  Diabetic Retinopathy.  In both eyes.  This started 7 months ago.  I, the attending physician,  performed the HPI with the patient and updated documentation appropriately.        Comments   Patient here for 7 months retina follow up for DM OU. Patient states vision doing good. No eye pain.       Last edited by Rennis Chris, MD on 06/26/2022  4:17 PM.    Pt states vision is stable, she states her A1c was 6.3 on February 2  Referring physician: Jarrett Soho, PA-C [address] [place],  [place] [address]  HISTORICAL INFORMATION:   Selected notes from the MEDICAL RECORD NUMBER Referred by [facility] for DM exam LEE: 06.17.19 (Dr. Sheran Fava, MD) [BCVA: OD: 20/25 OS: 20/30--]  Ocular Hx-non-exu ARMD, cataract OS, pseudo OD PMH-DM, CKD, HTN    CURRENT MEDICATIONS: Current Outpatient Medications (Ophthalmic Drugs)  Medication Sig   Carboxymethylcellulose Sodium (EYE DROPS OP) Apply to eye. Refresh Drops as needed before bed   No current facility-administered medications for this visit. (Ophthalmic Drugs)   Current Outpatient Medications (Other)  Medication Sig   aspirin EC 81 MG tablet Take 81 mg by mouth daily.   atorvastatin (LIPITOR) 80 MG tablet Take 1 tablet (80 mg total) by mouth daily.   citalopram (CELEXA) 20 MG tablet Take 20 mg by mouth daily.   CRANBERRY PO Take 4,200 capsules by mouth daily.   enalapril (VASOTEC) 5 MG tablet Take 1 tablet (5 mg total) by mouth daily.   Ergocalciferol (VITAMIN D2 PO) Take by mouth. 2-3 times a week   Ferrous Sulfate (IRON) 325 (65 Fe) MG TABS Take 1 tablet by mouth daily.   gabapentin (NEURONTIN) 300 MG capsule Take 300 mg by mouth  at bedtime.    Lactobacillus (PROBIOTIC ACIDOPHILUS PO) Take by mouth.   lubiprostone (AMITIZA) 24 MCG capsule Take 24 mcg by mouth 2 (two) times daily.   NIFEdipine (PROCARDIA XL/NIFEDICAL XL) 60 MG 24 hr tablet Take 1 tablet (60 mg total) by mouth daily.   oxybutynin (DITROPAN-XL) 5 MG 24 hr tablet Take 5 mg by mouth at bedtime.   repaglinide (PRANDIN) 0.5 MG tablet    triamcinolone ointment (KENALOG) 0.1 % Apply 1 Application topically 2 (two) times daily. To rash of right arm as directed. Avoid face, groin, underarms   vitamin C (ASCORBIC ACID) 500 MG tablet Take 500 mg by mouth daily.   No current facility-administered medications for this visit. (Other)   REVIEW OF SYSTEMS: ROS   Positive for: Endocrine, Cardiovascular, Eyes Negative for: Constitutional, Gastrointestinal, Neurological, Skin, Genitourinary, Musculoskeletal, HENT, Respiratory, Psychiatric, Allergic/Imm, Heme/Lymph Last edited by Laddie Aquas, COA on 06/25/2022  1:55 PM.      ALLERGIES Allergies  Allergen Reactions   Clindamycin/Lincomycin    Doxycycline    Penicillins     PAST MEDICAL HISTORY Past Medical History:  Diagnosis Date   Cataract    OD   Chronic kidney disease    Diabetes mellitus without complication    Hypertension    Hypertensive retinopathy    OU   Macular degeneration  Dry OU   Past Surgical History:  Procedure Laterality Date   CATARACT EXTRACTION Left    EYE SURGERY Left    Cat Sx   FAMILY HISTORY Family History  Problem Relation Age of Onset   Hypertension Mother    Heart disease Mother    Diabetes Mother    Canavan disease Father    Heart disease Maternal Grandmother    Heart disease Maternal Grandfather    Kidney failure Maternal Grandfather    SOCIAL HISTORY Social History   Tobacco Use   Smoking status: Never   Smokeless tobacco: Never  Vaping Use   Vaping Use: Never used  Substance Use Topics   Alcohol use: Not Currently   Drug use: Never        OPHTHALMIC EXAM:  Base Eye Exam     Visual Acuity (Snellen - Linear)       Right Left   Dist cc 20/30 20/30 +2   Dist ph cc 20/25 -2 NI    Correction: Glasses         Tonometry (Tonopen, 1:52 PM)       Right Left   Pressure 12 11         Pupils       Dark Light Shape React APD   Right 3 2 Round Brisk None   Left 3 2 Round Brisk None         Visual Fields (Counting fingers)       Left Right    Full Full         Extraocular Movement       Right Left    Full, Ortho Full, Ortho         Neuro/Psych     Oriented x3: Yes   Mood/Affect: Normal         Dilation     Both eyes: 1.0% Mydriacyl, 2.5% Phenylephrine @ 1:52 PM           Slit Lamp and Fundus Exam     Slit Lamp Exam       Right Left   Lids/Lashes Dermatochalasis - upper lid Dermatochalasis - upper lid   Conjunctiva/Sclera White and quiet White and quiet   Cornea Arcus, trace Punctate epithelial erosions, well healed cataract wound, trace Descemet's folds well healed cataract wound, trace Descemet's folds   Anterior Chamber deep and clear deep and clear   Iris Round and dilated, No NVI Round and dilated, No NVI   Lens PC IOL in good position PC IOL in good position with PC opening   Anterior Vitreous Mild syneresis, Posterior vitreous detachment, vitreous condensations Vitreous syneresis, Posterior vitreous detachment         Fundus Exam       Right Left   Disc Compact, mild Peripapillary atrophy, mild pallor, Sharp rim Pink and Sharp, tilted, temporal PPA   C/D Ratio 0.2 0.3   Macula Flat, Blunted foveal reflex, RPE mottling and clumping, Drusen, focal atrophy nasal to fovea, no heme, +PED Flat, good foveal reflex, fine drusen, mild Retinal pigment epithelial mottling, No heme or edema   Vessels attenuated, Tortuous attenuated, Tortuous   Periphery Attached, focal blot heme at 0600 Attached, No heme, peripheral cystoid degeneration           Refraction     Wearing Rx        Sphere Cylinder Axis Add   Right -2.00 +0.75 175 +2.75   Left -0.25 +1.00 105 +2.75  IMAGING AND PROCEDURES  Imaging and Procedures for @TODAY @  OCT, Retina - OU - Both Eyes       Right Eye Quality was good. Central Foveal Thickness: 300. Progression has been stable. Findings include normal foveal contour, no IRF, no SRF, retinal drusen , pigment epithelial detachment, outer retinal atrophy (Persistent central PED with adjacent ORA, no significant change from prior).   Left Eye Quality was good. Central Foveal Thickness: 274. Progression has been stable. Findings include normal foveal contour, no IRF, no SRF, retinal drusen (Very shallow peripheral schisis temporal periphery caught on widefield).   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 CME - cystoid macular edema. PED - pigment epithelial detachment. IRF - intraretinal fluid. SRF - subretinal fluid. EZ - ellipsoid zone. ERM - epiretinal membrane. ORA - outer retinal atrophy. ORT - outer retinal tubulation. SRHM - subretinal hyper-reflective material             ASSESSMENT/PLAN:    ICD-10-CM   1. Diabetes mellitus type 2 without retinopathy  E11.9 OCT, Retina - OU - Both Eyes    2. Intermediate stage nonexudative age-related macular degeneration of both eyes  H35.3132 OCT, Retina - OU - Both Eyes    3. 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 Essential hypertension  I10     6. Hypertensive retinopathy of both eyes  H35.033     7. Pseudophakia, both eyes  Z96.1      1. Diabetes mellitus, type 2 without retinopathy, OU  - A1c: 6.3 on  02.02.24  - The incidence, risk factors for progression, natural history and treatment options for diabetic retinopathy  were discussed with patient.    - The need for close monitoring of blood glucose, blood pressure, and serum lipids, avoiding cigarette or any type of tobacco, and the need for long term follow up was also discussed with patient.  - BCVA stable OU (20/25 OD, 20/30 OS)  - OCT without DME OU  - monitor  2. Age related macular degeneration, non-exudative, both eyes -- stable  - The incidence, anatomy, and pathology of dry AMD, risk of progression, and the AREDS and AREDS 2 study including smoking risks discussed with patient.  - OCT and exam show stable PED OD, will f/u in 6-9 months  - continue amsler grid monitoring  - continue AREDS 2 supplements  - f/u 912 months, DFE, OCT  3. Myopic degeneration OD  - mild low lying PED w/ overlying ORA nasal to fovea  - stable without IRF/SRF  - monitor  4. Shallow peripheral schisis OS  - OCT with shallow schisis temporal periphery caught on widefield -- stable  - monitor  5,6. Hypertensive retinopathy OU  - discussed importance of tight BP control  - monitor  7. Pseudophakia OU  - s/p CE/IOL OD (April 2023 with Dr. Sherrine Maples)  - s/p CE/IOL and YAG cap OS (2017, Dr. Clyda Greener. Orson Slick in [place])  - beautiful surgeries, doing well  - monitor  Ophthalmic Meds Ordered this visit:  No orders of the defined types were placed in this encounter.   Return for f/u  9-12 months, non-exu ARMD OU, DFE, OCT.  There are no Patient Instructions on file for this visit.  This document serves as a record of services personally performed by Karie Chimera, MD, PhD. It was created on their behalf by De Blanch, an ophthalmic technician. The creation of this record is the provider's dictation and/or activities during the visit.    Electronically signed by: De Blanch, OA, 06/26/22  4:18 PM   This document serves as a record of  services personally performed by Karie Chimera, MD, PhD. It was created on their behalf by Glee Arvin. Manson Passey, OA an ophthalmic technician. The creation of this record is the provider's dictation and/or activities during the visit.    Electronically signed by: Glee Arvin. [place], [place] 04.10.2024 4:18 PM  Karie Chimera, M.D., Ph.D. Diseases & Surgery of the Retina and Vitreous [facility]  I have reviewed the above documentation for accuracy and completeness, and I agree with the above. Karie Chimera, M.D., Ph.D. 06/26/22 4:20 PM  Abbreviations: M myopia (nearsighted); A astigmatism; H hyperopia (farsighted); P presbyopia; Mrx spectacle prescription;  CTL contact lenses; OD right eye; OS left eye; OU both eyes  XT exotropia; ET esotropia; PEK punctate epithelial keratitis; PEE punctate epithelial erosions; DES dry eye syndrome; MGD meibomian gland dysfunction; ATs artificial tears; PFAT's preservative free artificial tears; NSC nuclear sclerotic cataract; PSC posterior subcapsular cataract; ERM epi-retinal membrane; PVD posterior vitreous detachment; RD retinal detachment; DM diabetes mellitus; DR diabetic retinopathy; NPDR non-proliferative diabetic retinopathy; PDR proliferative diabetic retinopathy; CSME clinically significant macular edema; DME diabetic macular edema; dbh dot blot hemorrhages; CWS cotton wool spot; POAG primary open angle glaucoma; C/D cup-to-disc ratio; HVF humphrey visual field; GVF goldmann visual field; OCT optical coherence tomography; IOP intraocular pressure; BRVO Branch retinal vein occlusion; CRVO central retinal vein occlusion; CRAO central retinal artery occlusion; BRAO branch retinal artery occlusion; RT retinal tear; SB scleral buckle; PPV pars plana vitrectomy; VH Vitreous hemorrhage; PRP panretinal laser photocoagulation; IVK intravitreal kenalog; VMT vitreomacular traction; MH Macular hole;  NVD neovascularization of the disc; NVE neovascularization  elsewhere; AREDS age related eye disease study; ARMD age related macular degeneration; POAG primary open angle glaucoma; EBMD epithelial/anterior basement membrane dystrophy; ACIOL anterior chamber intraocular lens; IOL intraocular lens; PCIOL posterior chamber intraocular lens; Phaco/IOL phacoemulsification with intraocular lens placement; PRK photorefractive keratectomy; LASIK laser assisted in situ keratomileusis; HTN hypertension; DM diabetes mellitus; COPD chronic obstructive pulmonary disease

## 2022-06-25 ENCOUNTER — Encounter (INDEPENDENT_AMBULATORY_CARE_PROVIDER_SITE_OTHER): Payer: Self-pay | Admitting: Ophthalmology

## 2022-06-25 ENCOUNTER — Ambulatory Visit (INDEPENDENT_AMBULATORY_CARE_PROVIDER_SITE_OTHER): Payer: PPO | Admitting: Ophthalmology

## 2022-06-25 DIAGNOSIS — H35033 Hypertensive retinopathy, bilateral: Secondary | ICD-10-CM | POA: Diagnosis not present

## 2022-06-25 DIAGNOSIS — H353132 Nonexudative age-related macular degeneration, bilateral, intermediate dry stage: Secondary | ICD-10-CM | POA: Diagnosis not present

## 2022-06-25 DIAGNOSIS — H33102 Unspecified retinoschisis, left eye: Secondary | ICD-10-CM

## 2022-06-25 DIAGNOSIS — I10 Essential (primary) hypertension: Secondary | ICD-10-CM | POA: Diagnosis not present

## 2022-06-25 DIAGNOSIS — E119 Type 2 diabetes mellitus without complications: Secondary | ICD-10-CM

## 2022-06-25 DIAGNOSIS — Z961 Presence of intraocular lens: Secondary | ICD-10-CM

## 2022-06-25 DIAGNOSIS — H4421 Degenerative myopia, right eye: Secondary | ICD-10-CM | POA: Diagnosis not present

## 2022-06-26 ENCOUNTER — Encounter (INDEPENDENT_AMBULATORY_CARE_PROVIDER_SITE_OTHER): Payer: Self-pay | Admitting: Ophthalmology

## 2022-07-03 ENCOUNTER — Ambulatory Visit (INDEPENDENT_AMBULATORY_CARE_PROVIDER_SITE_OTHER): Payer: PPO | Admitting: Dermatology

## 2022-07-03 VITALS — BP 90/58

## 2022-07-03 DIAGNOSIS — R21 Rash and other nonspecific skin eruption: Secondary | ICD-10-CM

## 2022-07-03 DIAGNOSIS — L810 Postinflammatory hyperpigmentation: Secondary | ICD-10-CM

## 2022-07-03 NOTE — Progress Notes (Signed)
   Follow-Up Visit   Subjective  Kimberly Rivers is a 72 y.o. female who presents for the following: Nummular eczema vs GA follow up - TMC 0.1% cream and Terbinafine cream - she feels like rash is much better and almost gone   The following portions of the chart were reviewed this encounter and updated as appropriate: medications, allergies, medical history  Review of Systems:  No other skin or systemic complaints except as noted in HPI or Assessment and Plan.  Objective  Well appearing patient in no apparent distress; mood and affect are within normal limits.  .  A focused examination was performed of the following areas: Right arm, left leg   Relevant exam findings are noted in the Assessment and Plan.    Assessment & Plan   Rash Exam: Clear today  Differential diagnosis:  Nummular dermatitis - Resolved  Treatment Plan: Discontinue TMC 0.1% ointment - may restart if rash flares. Discontinue Terbinafine cream.  POST-INFLAMMATORY HYPERPIGMENTATION (PIH) Exam: hyperpigmented macules and/or patches at face   This is a benign condition that comes from having previous inflammation in the skin and will fade with time over months to sometimes years. Recommend daily sun protection including sunscreen SPF 30+ to sun-exposed areas. - Recommend treating any itchy or red areas on the skin quickly to prevent new areas of PIH. Treating with prescription medicines such as hydroquinone may help fade dark spots faster.    Treatment Plan:  Recommend moisturizer daily - samples of Cerave and Neutrogena Hydroboost given today.    Return if symptoms worsen or fail to improve.  I, Joanie Coddington, CMA, am acting as scribe for Langston Reusing, MD .   Documentation: I have reviewed the above documentation for accuracy and completeness, and I agree with the above.  Langston Reusing, MD  +

## 2022-07-03 NOTE — Patient Instructions (Signed)
Due to recent changes in healthcare laws, you may see results of your pathology and/or laboratory studies on MyChart before the doctors have had a chance to review them. We understand that in some cases there may be results that are confusing or concerning to you. Please understand that not all results are received at the same time and often the doctors may need to interpret multiple results in order to provide you with the best plan of care or course of treatment. Therefore, we ask that you please give us 2 business days to thoroughly review all your results before contacting the office for clarification. Should we see a critical lab result, you will be contacted sooner.   If You Need Anything After Your Visit  If you have any questions or concerns for your doctor, please call our main line at [phone] If no one answers, please leave a voicemail as directed and we will return your call as soon as possible. Messages left after 4 pm will be answered the following business day.   You may also send us a message via MyChart. We typically respond to MyChart messages within 1-2 business days.  For prescription refills, please ask your pharmacy to contact our office. Our fax number is [phone].  If you have an urgent issue when the clinic is closed that cannot wait until the next business day, you can page your doctor at the number below.    Please note that while we do our best to be available for urgent issues outside of office hours, we are not available 24/7.   If you have an urgent issue and are unable to reach us, you may choose to seek medical care at your doctor's office, retail clinic, urgent care center, or emergency room.  If you have a medical emergency, please immediately call 911 or go to the emergency department. In the event of inclement weather, please call our main line at [phone] for an update on the status of any delays or closures.  Dermatology Medication Tips: Please  keep the boxes that topical medications come in in order to help keep track of the instructions about where and how to use these. Pharmacies typically print the medication instructions only on the boxes and not directly on the medication tubes.   If your medication is too expensive, please contact our office at [phone] or send us a message through MyChart.   We are unable to tell what your co-pay for medications will be in advance as this is different depending on your insurance coverage. However, we may be able to find a substitute medication at lower cost or fill out paperwork to get insurance to cover a needed medication.   If a prior authorization is required to get your medication covered by your insurance company, please allow us 1-2 business days to complete this process.  Drug prices often vary depending on where the prescription is filled and some pharmacies may offer cheaper prices.  The website www.goodrx.com contains coupons for medications through different pharmacies. The prices here do not account for what the cost may be with help from insurance (it may be cheaper with your insurance), but the website can give you the price if you did not use any insurance.  - You can print the associated coupon and take it with your prescription to the pharmacy.  - You may also stop by our office during regular business hours and pick up a GoodRx coupon card.  - If you need your   prescription sent electronically to a different pharmacy, notify our office through [facility] MyChart or by phone at [phone]     

## 2022-07-08 ENCOUNTER — Encounter: Payer: Self-pay | Admitting: Dermatology

## 2022-09-11 ENCOUNTER — Ambulatory Visit: Payer: PPO | Admitting: Adult Health

## 2022-09-11 DIAGNOSIS — Z0000 Encounter for general adult medical examination without abnormal findings: Secondary | ICD-10-CM | POA: Diagnosis not present

## 2022-09-11 DIAGNOSIS — E663 Overweight: Secondary | ICD-10-CM | POA: Diagnosis not present

## 2022-09-11 DIAGNOSIS — Z1389 Encounter for screening for other disorder: Secondary | ICD-10-CM | POA: Diagnosis not present

## 2022-09-11 DIAGNOSIS — Z9181 History of falling: Secondary | ICD-10-CM | POA: Diagnosis not present

## 2022-09-17 ENCOUNTER — Encounter: Payer: Self-pay | Admitting: Nurse Practitioner

## 2022-09-17 ENCOUNTER — Ambulatory Visit: Payer: PPO | Attending: Adult Health | Admitting: Nurse Practitioner

## 2022-09-17 VITALS — BP 132/52 | HR 58 | Ht 59.0 in | Wt 127.2 lb

## 2022-09-17 DIAGNOSIS — E118 Type 2 diabetes mellitus with unspecified complications: Secondary | ICD-10-CM | POA: Diagnosis not present

## 2022-09-17 DIAGNOSIS — R0609 Other forms of dyspnea: Secondary | ICD-10-CM

## 2022-09-17 DIAGNOSIS — Z794 Long term (current) use of insulin: Secondary | ICD-10-CM

## 2022-09-17 DIAGNOSIS — E785 Hyperlipidemia, unspecified: Secondary | ICD-10-CM | POA: Diagnosis not present

## 2022-09-17 DIAGNOSIS — I6523 Occlusion and stenosis of bilateral carotid arteries: Secondary | ICD-10-CM | POA: Diagnosis not present

## 2022-09-17 DIAGNOSIS — I25118 Atherosclerotic heart disease of native coronary artery with other forms of angina pectoris: Secondary | ICD-10-CM | POA: Diagnosis not present

## 2022-09-17 DIAGNOSIS — I2089 Other forms of angina pectoris: Secondary | ICD-10-CM

## 2022-09-17 DIAGNOSIS — I10 Essential (primary) hypertension: Secondary | ICD-10-CM | POA: Diagnosis not present

## 2022-09-17 NOTE — Patient Instructions (Signed)
Medication Instructions:  Your physician recommends that you continue on your current medications as directed. Please refer to the Current Medication list given to you today.  *If you need a refill on your cardiac medications before your next appointment, please call your pharmacy*   Lab Work: NONE ordered at this time of appointment    Testing/Procedures: How to Prepare for Your Cardiac PET/CT Stress Test:  1. Please do not take these medications before your test:   Medications that may interfere with the cardiac pharmacological stress agent (ex. nitrates - including erectile dysfunction medications, isosorbide mononitrate, tamulosin or beta-blockers) the day of the exam. (Erectile dysfunction medication should be held for at least 72 hrs prior to test) Theophylline containing medications for 12 hours. Dipyridamole 48 hours prior to the test. Your remaining medications may be taken with water.  2. Nothing to eat or drink, except water, 3 hours prior to arrival time.   NO caffeine/decaffeinated products, or chocolate 12 hours prior to arrival.  3. NO perfume, cologne or lotion  4. Total time is 1 to 2 hours; you may want to bring reading material for the waiting time.  5. Please report to Radiology at the [facility] Main Entrance 30 minutes early for your test.  [address] [place], [place] [address]  Diabetic Preparation:  Hold oral medications. You may take NPH and Lantus insulin. Do not take Humalog or Humulin R (Regular Insulin) the day of your test. Check blood sugars prior to leaving the house. If able to eat breakfast prior to 3 hour fasting, you may take all medications, including your insulin, Do not worry if you miss your breakfast dose of insulin - start at your next meal.  IF YOU THINK YOU MAY BE PREGNANT, OR ARE NURSING PLEASE INFORM THE TECHNOLOGIST.  In preparation for your appointment, medication and supplies will be purchased.  Appointment  availability is limited, so if you need to cancel or reschedule, please call the Radiology Department at [phone]  24 hours in advance to avoid a cancellation fee of $100.00  What to Expect After you Arrive:  Once you arrive and check in for your appointment, you will be taken to a preparation room within the Radiology Department.  A technologist or Nurse will obtain your medical history, verify that you are correctly prepped for the exam, and explain the procedure.  Afterwards,  an IV will be started in your arm and electrodes will be placed on your skin for EKG monitoring during the stress portion of the exam. Then you will be escorted to the PET/CT scanner.  There, staff will get you positioned on the scanner and obtain a blood pressure and EKG.  During the exam, you will continue to be connected to the EKG and blood pressure machines.  A small, safe amount of a radioactive tracer will be injected in your IV to obtain a series of pictures of your heart along with an injection of a stress agent.    After your Exam:  It is recommended that you eat a meal and drink a caffeinated beverage to counter act any effects of the stress agent.  Drink plenty of fluids for the remainder of the day and urinate frequently for the first couple of hours after the exam.  Your doctor will inform you of your test results within 7-10 business days.  For more information and frequently asked questions, please visit our website : [email or web address]  For questions about your test or how  to prepare for your test, please call: Rockwell Alexandria, Cardiac Imaging Nurse Navigator  Larey Brick, Cardiac Imaging Nurse Navigator Office: [phone]   Follow-Up: At [facility], you and your health needs are our priority.  As part of our continuing mission to provide you with exceptional heart care, we have created designated Provider Care Teams.  These Care Teams include your primary Cardiologist  (physician) and Advanced Practice Providers (APPs -  Physician Assistants and Nurse Practitioners) who all work together to provide you with the care you need, when you need it.  We recommend signing up for the patient portal called "MyChart".  Sign up information is provided on this After Visit Summary.  MyChart is used to connect with patients for Virtual Visits (Telemedicine).  Patients are able to view lab/test results, encounter notes, upcoming appointments, etc.  Non-urgent messages can be sent to your provider as well.   To learn more about what you can do with MyChart, go to [email or web address].    Your next appointment:   8 week(s)  Provider:   Bernadene Person, NP        Other Instructions

## 2022-09-17 NOTE — Progress Notes (Signed)
Office Visit    Patient Name: Kimberly Rivers Date of Encounter: 09/17/2022  Primary Care Provider:  Jarrett Soho, PA-C Primary Cardiologist:  Nanetta Batty, MD  Chief Complaint    72 year old female with a history of CAD s/p DES x 2-mLAD in 2010 in [place], carotid artery disease, hypertension, hyperlipidemia, and type 2 diabetes who presents for follow-up related to CAD.   Past Medical History    Past Medical History:  Diagnosis Date   Cataract    OD   Chronic kidney disease    Diabetes mellitus without complication (HCC)    Hypertension    Hypertensive retinopathy    OU   Macular degeneration    Dry OU   Past Surgical History:  Procedure Laterality Date   CATARACT EXTRACTION Left    EYE SURGERY Left    Cat Sx    Allergies  Allergies  Allergen Reactions   Clindamycin/Lincomycin    Doxycycline    Penicillins      Labs/Other Studies Reviewed    The following studies were reviewed today:  Cardiac Studies & Procedures       ECHOCARDIOGRAM  ECHOCARDIOGRAM COMPLETE 01/18/2021  Narrative ECHOCARDIOGRAM REPORT    [name] Date of Exam: 01/18/2021 Medical Rec #:  [id number]          Height:       59.0 in Accession #:    [id number]         Weight:       123.0 lb Date of Birth:  08/13/1950         BSA:          1.500 m Patient Age:    70 years           BP:           124/52 mmHg Patient Gender: F                  HR:           56 bpm. Exam Location:  [address]  Procedure: 2D Echo, 3D Echo, Cardiac Doppler, Color Doppler and Strain Analysis  Indications:    I10 Hypertension  History:        Patient has no prior history of Echocardiogram examinations. CAD; Risk Factors:Hypertension, Diabetes and Dyslipidemia.  Sonographer:    Jorje Guild BS, RDCS Referring Phys: [phone] Delton See BERRY   Sonographer Comments: Patient declined Definity due to allergy to Miralax (polyethylene glycol). IMPRESSIONS   1. Left  ventricular ejection fraction, by estimation, is 65 to 70%. Left ventricular ejection fraction by 3D volume is 71 %. The left ventricle has normal function. The left ventricle has no regional wall motion abnormalities. Left ventricular diastolic parameters are consistent with Grade I diastolic dysfunction (impaired relaxation). The average left ventricular global longitudinal strain is -25.7 %. The global longitudinal strain is normal. There is a small intracavitary gradient < 30 mm Hg with Valsalva. 2. Right ventricular systolic function is normal. The right ventricular size is normal. 3. The mitral valve is normal in structure. No evidence of mitral valve regurgitation. No evidence of mitral stenosis. 4. The aortic valve is tricuspid. Aortic valve regurgitation is not visualized. No aortic stenosis is present. 5. The inferior vena cava is normal in size with greater than 50% respiratory variability, suggesting right atrial pressure of 3 mmHg.  Comparison(s): No prior Echocardiogram.  FINDINGS Left Ventricle: Left ventricular ejection fraction, by estimation, is 65 to 70%.  Left ventricular ejection fraction by 3D volume is 71 %. The left ventricle has normal function. The left ventricle has no regional wall motion abnormalities. The average left ventricular global longitudinal strain is -25.7 %. The global longitudinal strain is normal. The left ventricular internal cavity size was normal in size. There is no left ventricular hypertrophy. Left ventricular diastolic parameters are consistent with Grade I diastolic dysfunction (impaired relaxation).  Right Ventricle: The right ventricular size is normal. No increase in right ventricular wall thickness. Right ventricular systolic function is normal.  Left Atrium: Left atrial size was normal in size.  Right Atrium: Right atrial size was normal in size.  Pericardium: Trivial pericardial effusion is present.  Mitral Valve: The mitral valve is  normal in structure. No evidence of mitral valve regurgitation. No evidence of mitral valve stenosis.  Tricuspid Valve: The tricuspid valve is normal in structure. Tricuspid valve regurgitation is not demonstrated. No evidence of tricuspid stenosis.  Aortic Valve: The aortic valve is tricuspid. Aortic valve regurgitation is not visualized. No aortic stenosis is present.  Pulmonic Valve: The pulmonic valve was normal in structure. Pulmonic valve regurgitation is not visualized. No evidence of pulmonic stenosis.  Aorta: The aortic root and ascending aorta are structurally normal, with no evidence of dilitation.  Venous: The inferior vena cava is normal in size with greater than 50% respiratory variability, suggesting right atrial pressure of 3 mmHg.  IAS/Shunts: The atrial septum is grossly normal.   LEFT VENTRICLE PLAX 2D LVIDd:         3.80 cm         Diastology LVIDs:         1.55 cm         LV e' medial:    5.43 cm/s LV PW:         1.10 cm         LV E/e' medial:  16.9 LV IVS:        0.95 cm         LV e' lateral:   9.69 cm/s LVOT diam:     1.90 cm         LV E/e' lateral: 9.5 LV SV:         87 LV SV Index:   58              2D LVOT Area:     2.84 cm        Longitudinal Strain 2D Strain GLS  -27.9 % (A2C): 2D Strain GLS  -23.6 % (A3C): 2D Strain GLS  -25.7 % (A4C): 2D Strain GLS  -25.7 % Avg:  3D Volume EF LV 3D EF:    Left ventricul ar ejection fraction by 3D volume is 71 %.  3D Volume EF: 3D EF:        71 % LV EDV:       103 ml LV ESV:       30 ml LV SV:        73 ml  RIGHT VENTRICLE             IVC RV Basal diam:  2.80 cm     IVC diam: 1.10 cm RV S prime:     10.80 cm/s TAPSE (M-mode): 2.5 cm  LEFT ATRIUM             Index        RIGHT ATRIUM           Index LA diam:  3.70 cm 2.47 cm/m   RA Pressure: 3.00 mmHg LA Vol (A2C):   32.2 ml 21.47 ml/m  RA Area:     15.20 cm LA Vol (A4C):   27.8 ml 18.53 ml/m  RA Volume:   36.80 ml  24.53 ml/m LA  Biplane Vol: 33.0 ml 22.00 ml/m AORTIC VALVE LVOT Vmax:   136.00 cm/s LVOT Vmean:  89.400 cm/s LVOT VTI:    0.308 m  AORTA Ao Root diam: 2.80 cm Ao Asc diam:  2.70 cm  MITRAL VALVE                TRICUSPID VALVE Estimated RAP:  3.00 mmHg MV Decel Time: 222 msec MV E velocity: 91.70 cm/s   SHUNTS MV A velocity: 103.00 cm/s  Systemic VTI:  0.31 m MV E/A ratio:  0.89         Systemic Diam: 1.90 cm  Riley Lam MD Electronically signed by Riley Lam MD Signature Date/Time: 01/18/2021/2:20:14 PM    Final            Recent Labs: 03/05/2022: ALT 14  Recent Lipid Panel    Component Value Date/Time   CHOL 152 03/05/2022 0901   TRIG 49 03/05/2022 0901   HDL 81 03/05/2022 0901   CHOLHDL 1.9 03/05/2022 0901   LDLCALC 60 03/05/2022 0901    History of Present Illness    72 year old female with with the above past medical history including CAD s/p DES x 2-mLAD in 2010 in [place], carotid artery disease, hypertension, hyperlipidemia, and type 2 diabetes.   He has a history of CAD s/p DES x 2-LAD in February 2010 in [place].  Myoview in 2019 was nonischemic.  Echocardiogram May 2022 showed EF 65 to 70%, normal LV function, no RWMA, G1 DD, normal RV, no significant valvular abnormalities.  Carotid ultrasound in 12/2021 revealed 1 to 39% R ICA stenosis, 40 to 59% LICA stenosis.  Follow-up study was recommended in 1 year.  She was last seen in the office on 01/01/2022 and was doing well from a cardiac standpoint.  She denied symptoms concerning for angina. ABIs in 04/2022 were essentially normal.  She presents today for follow-up.  Since her last visit she has been stable from a cardiac standpoint though she notes over the past 2 months increased dyspnea on exertion.  She states this was her only symptom prior to her stents in 2010.  Denies chest pain, palpitations, edema, PND, orthopnea, weight gain. Other than her dyspnea on exertion, she denies any additional concerns  today.   Home Medications    Current Outpatient Medications  Medication Sig Dispense Refill   aspirin EC 81 MG tablet Take 81 mg by mouth daily.     atorvastatin (LIPITOR) 80 MG tablet Take 1 tablet (80 mg total) by mouth daily. 90 tablet 3   Carboxymethylcellulose Sodium (EYE DROPS OP) Apply to eye. Refresh Drops as needed before bed     citalopram (CELEXA) 20 MG tablet Take 20 mg by mouth daily.     CRANBERRY PO Take 4,200 capsules by mouth daily.     enalapril (VASOTEC) 5 MG tablet Take 1 tablet (5 mg total) by mouth daily. 90 tablet 3   Ergocalciferol (VITAMIN D2 PO) Take by mouth. 2-3 times a week     Ferrous Sulfate (IRON) 325 (65 Fe) MG TABS Take 1 tablet by mouth daily.     gabapentin (NEURONTIN) 300 MG capsule Take 300 mg by mouth at bedtime.  Lactobacillus (PROBIOTIC ACIDOPHILUS PO) Take by mouth.     lubiprostone (AMITIZA) 24 MCG capsule Take 24 mcg by mouth 2 (two) times daily.     NIFEdipine (PROCARDIA XL/NIFEDICAL XL) 60 MG 24 hr tablet Take 1 tablet (60 mg total) by mouth daily. 90 tablet 3   oxybutynin (DITROPAN-XL) 5 MG 24 hr tablet Take 5 mg by mouth at bedtime.     repaglinide (PRANDIN) 0.5 MG tablet      vitamin C (ASCORBIC ACID) 500 MG tablet Take 500 mg by mouth daily.     triamcinolone ointment (KENALOG) 0.1 % Apply 1 Application topically 2 (two) times daily. To rash of right arm as directed. Avoid face, groin, underarms 80 g 0   No current facility-administered medications for this visit.     Review of Systems    She denies chest pain, palpitations, pnd, orthopnea, n, v, dizziness, syncope, edema, weight gain, or early satiety. All other systems reviewed and are otherwise negative except as noted above.   Physical Exam    VS:  BP (!) 132/52 (BP Location: Left Arm, Patient Position: Sitting, Cuff Size: Normal)   Pulse (!) 58   Ht 4\' 11"  (1.499 m)   Wt 127 lb 3.2 oz (57.7 kg)   SpO2 96%   BMI 25.69 kg/m   GEN: Well nourished, well developed, in no  acute distress. HEENT: normal. Neck: Supple, no JVD, carotid bruits, or masses. Cardiac: RRR, no murmurs, rubs, or gallops. No clubbing, cyanosis, edema.  Radials/DP/PT 2+ and equal bilaterally.  Respiratory:  Respirations regular and unlabored, clear to auscultation bilaterally. GI: Soft, nontender, nondistended, BS + x 4. MS: no deformity or atrophy. Skin: warm and dry, no rash. Neuro:  Strength and sensation are intact. Psych: Normal affect.  Accessory Clinical Findings    ECG personally reviewed by me today - EKG Interpretation Date/Time:  Wednesday September 17 2022 09:58:03 EDT Ventricular Rate:  58 PR Interval:  152 QRS Duration:  134 QT Interval:  450 QTC Calculation: 441 R Axis:   93  Text Interpretation: Sinus rhythm with Blocked Premature atrial complexes Right bundle branch block No previous ECGs available Confirmed by Bernadene Person ([id number]) on 09/17/2022 10:02:29 AM  - no acute changes.   No results found for: "WBC", "HGB", "HCT", "MCV", "PLT" No results found for: "CREATININE", "BUN", "NA", "K", "CL", "CO2" Lab Results  Component Value Date   ALT 14 03/05/2022   AST 15 03/05/2022   ALKPHOS 50 03/05/2022   BILITOT 0.4 03/05/2022   Lab Results  Component Value Date   CHOL 152 03/05/2022   HDL 81 03/05/2022   LDLCALC 60 03/05/2022   TRIG 49 03/05/2022   CHOLHDL 1.9 03/05/2022    No results found for: "HGBA1C"  Assessment & Plan    1. CAD/dyspnea on exertion/anginal equivalent: S/p DES x 2-LAD in February 2010.   Myoview in 2019 was nonischemic. Echocardiogram May 2022 showed EF 65 to 70%, normal LV function, no RWMA, G1 DD, normal RV, no significant valvular abnormalities.  She notes a 87-month history of increased dyspnea on exertion, similar to prior anginal equivalent.  She is only able to walk a few feet before having to stop to catch her breath.  She denies chest pain, edema, PND, orthopnea, weight gain.  Euvolemic and well compensated on exam.  Given significant  dyspnea with exertion, she is not an ideal candidate for treadmill test.  Given concern for possible anginal equivalent, and through shared decision making, will pursue cardiac  PET stress test.  Reviewed ED precautions.  Continue aspirin, enalapril, nifedipine, and Lipitor.  Informed Consent   Shared Decision Making/Informed Consent The risks [chest pain, shortness of breath, cardiac arrhythmias, dizziness, blood pressure fluctuations, myocardial infarction, stroke/transient ischemic attack, nausea, vomiting, allergic reaction, radiation exposure, metallic taste sensation and life-threatening complications (estimated to be 1 in 10,000)], benefits (risk stratification, diagnosing coronary artery disease, treatment guidance) and alternatives of a cardiac PET stress test were discussed in detail with Ms. Crumb and she agrees to proceed.    2. Carotid artery disease:Carotid ultrasound in 12/2021 revealed 1 to 39% R ICA stenosis, 40 to 59% LICA stenosis.  Follow-up study was recommended in 1 year (12/2022).  3. Hypertension: BP well controlled. Continue current antihypertensive regimen.   4. Hyperlipidemia: LDL was 60 in 02/2022.  Continue Lipitor.  5. Type 2 diabetes: A1c was 6.3 in 03/2022. Monitored and manged per PCP.   Disposition: Follow-up in 8 weeks.        Joylene Grapes, NP 09/17/2022, 10:26 AM

## 2022-09-19 DIAGNOSIS — B351 Tinea unguium: Secondary | ICD-10-CM | POA: Diagnosis not present

## 2022-09-19 DIAGNOSIS — Z6823 Body mass index (BMI) 23.0-23.9, adult: Secondary | ICD-10-CM | POA: Diagnosis not present

## 2022-09-24 ENCOUNTER — Ambulatory Visit: Payer: PPO | Admitting: Podiatry

## 2022-09-24 ENCOUNTER — Encounter: Payer: Self-pay | Admitting: Podiatry

## 2022-09-24 DIAGNOSIS — M79675 Pain in left toe(s): Secondary | ICD-10-CM | POA: Diagnosis not present

## 2022-09-24 DIAGNOSIS — M79674 Pain in right toe(s): Secondary | ICD-10-CM

## 2022-09-24 DIAGNOSIS — B351 Tinea unguium: Secondary | ICD-10-CM | POA: Diagnosis not present

## 2022-09-24 DIAGNOSIS — E119 Type 2 diabetes mellitus without complications: Secondary | ICD-10-CM

## 2022-09-24 DIAGNOSIS — N1831 Chronic kidney disease, stage 3a: Secondary | ICD-10-CM

## 2022-09-24 NOTE — Progress Notes (Signed)
  Subjective:  Patient ID: Kimberly Rivers, female    DOB: [birth year],   MRN: [id number]  Chief Complaint  Patient presents with   Nail Problem    Hallux bilateral - toenails split, right broke off, the left still very thick on the end, PCP said she could have a fungus and referred here   New Patient (Initial Visit)    72 y.o. female presents for concern of bilateral great toenail fungus that has been going on for several months. Does not recall and specific injury. Concern of thickened elongated and painful nails that are difficult to trim. Requesting to have them trimmed today.  Marland Kitchen   PCP:  Jarrett Soho, PA-C   s. Denies any current treatments. She is diabetic and last A1c was 6.3.   PCP:  Jarrett Soho, PA-C    . Denies any other pedal complaints. Denies n/v/f/c.   Past Medical History:  Diagnosis Date   Cataract    OD   Chronic kidney disease    Diabetes mellitus without complication (HCC)    Hypertension    Hypertensive retinopathy    OU   Macular degeneration    Dry OU    Objective:  Physical Exam: Vascular: DP/PT pulses 2/4 bilateral. CFT <3 seconds. Absent hair growth on digits. Edema noted to bilateral lower extremities. Xerosis noted bilaterally.  Skin. No lacerations or abrasions bilateral feet. Nails 1-5 bilateral  are thickened discolored and elongated with subungual debris.  Musculoskeletal: MMT 5/5 bilateral lower extremities in DF, PF, Inversion and Eversion. Deceased ROM in DF of ankle joint.  Neurological: Sensation intact to light touch. Protective sensation diminished bilateral.    Assessment:   1. Pain due to onychomycosis of toenails of both feet   2. Chronic kidney disease (CKD) stage G3a/A3, moderately decreased glomerular filtration rate (GFR) between 45-59 mL/min/1.73 square meter and albuminuria creatinine ratio greater than 300 mg/g (HCC)   3. Type 2 diabetes mellitus without complication, without long-term current use of insulin (HCC)       Plan:  Patient was evaluated and treated and all questions answered. -Discussed and educated patient on diabetic foot care, especially with  regards to the vascular, neurological and musculoskeletal systems.  -Stressed the importance of good glycemic control and the detriment of not  controlling glucose levels in relation to the foot. -Discussed supportive shoes at all times and checking feet regularly.  -Mechanically debrided all nails 1-5 bilateral using sterile nail nipper and filed with dremel without incident  -Answered all patient questions -Patient to return  in 3 months for at risk foot care -Patient advised to call the office if any problems or questions arise in the meantime.   Louann Sjogren, DPM

## 2022-10-24 ENCOUNTER — Encounter (HOSPITAL_COMMUNITY): Payer: Self-pay

## 2022-10-28 ENCOUNTER — Ambulatory Visit (HOSPITAL_COMMUNITY)
Admission: RE | Admit: 2022-10-28 | Discharge: 2022-10-28 | Disposition: A | Discharge status: Home / Self Care | Payer: PPO | Source: Ambulatory Visit | Attending: Nurse Practitioner | Admitting: Nurse Practitioner

## 2022-10-28 DIAGNOSIS — I25118 Atherosclerotic heart disease of native coronary artery with other forms of angina pectoris: Secondary | ICD-10-CM | POA: Diagnosis not present

## 2022-10-28 DIAGNOSIS — R932 Abnormal findings on diagnostic imaging of liver and biliary tract: Secondary | ICD-10-CM | POA: Diagnosis not present

## 2022-10-28 DIAGNOSIS — R0609 Other forms of dyspnea: Secondary | ICD-10-CM | POA: Diagnosis not present

## 2022-10-28 DIAGNOSIS — I700 Atherosclerosis of aorta: Secondary | ICD-10-CM | POA: Diagnosis not present

## 2022-10-28 LAB — NM PET CT CARDIAC PERFUSION MULTI W/ABSOLUTE BLOODFLOW
LV dias vol: 57 mL (ref 46–106)
LV sys vol: 20 mL
MBFR: 2.75
Nuc Rest EF: 65 %
Nuc Stress EF: 75 %
Rest MBF: 0.8 ml/g/min
Rest Nuclear Isotope Dose: 14.8 mCi
Rest perfusion cavity size (mL): 57 mL
ST Depression (mm): 0 mm
Stress MBF: 2.2 ml/g/min
Stress Nuclear Isotope Dose: 15 mCi
Stress perfusion cavity size (mL): 64 mL

## 2022-10-28 MED ORDER — RUBIDIUM RB82 GENERATOR (RUBYFILL)
15.0000 | PACK | Freq: Once | INTRAVENOUS | Status: AC
  Administered 2022-10-28: 15 via INTRAVENOUS

## 2022-10-28 MED ORDER — REGADENOSON 0.4 MG/5ML IV SOLN
0.4000 mg | Freq: Once | INTRAVENOUS | Status: AC
  Administered 2022-10-28: 0.4 mg via INTRAVENOUS

## 2022-10-28 MED ORDER — REGADENOSON 0.4 MG/5ML IV SOLN
INTRAVENOUS | Status: AC
  Filled 2022-10-28: qty 5

## 2022-10-28 MED ORDER — RUBIDIUM RB82 GENERATOR (RUBYFILL)
14.8000 | PACK | Freq: Once | INTRAVENOUS | Status: AC
  Administered 2022-10-28: 14.8 via INTRAVENOUS

## 2022-10-28 NOTE — Progress Notes (Signed)
Patient presents for a cardiac PET stress test and tolerated procedure without incident. Patient maintained acceptable vital signs throughout the test and was offered caffeine after test.  Patient ambulated out of department with a steady gait.  

## 2022-10-29 ENCOUNTER — Telehealth: Payer: Self-pay

## 2022-10-29 DIAGNOSIS — I2510 Atherosclerotic heart disease of native coronary artery without angina pectoris: Secondary | ICD-10-CM

## 2022-10-29 DIAGNOSIS — I25118 Atherosclerotic heart disease of native coronary artery with other forms of angina pectoris: Secondary | ICD-10-CM

## 2022-10-29 NOTE — Telephone Encounter (Signed)
Spoke with pt. Pt was notified of Cardiac PET stress test. Pt will f/u as planned.

## 2022-10-30 NOTE — Telephone Encounter (Signed)
Called pt with results from Dr. Allyson Sabal. Pt verbalized understanding. This nurse advised her to keep an eye out for a phone call to schedule her ECHO.  There is nothing signif on cardiac PET regarding her lungs that would require a pulmonologist. I will leave it up to her PCP if she needs referral for her symptoms. Cardiac PET was non ischemic and low risk. Please order a 2D echo to complete her eval

## 2022-10-30 NOTE — Telephone Encounter (Signed)
Pt called in about these results. She said she was told her heart was fine but there was something wrong wit her lungs and she wants to know if she should see a pulmonologist

## 2022-10-31 NOTE — Addendum Note (Signed)
Addended by: Hayden Pedro A on: 10/31/2022 02:42 PM   Modules accepted: Orders

## 2022-10-31 NOTE — Telephone Encounter (Signed)
Called pt with sooner time for echocardiogram. Pt has office visit with Irving Burton on 8/28 and is now doing her echo on 8/23 at [facility]. Pt verbalizes understanding.

## 2022-11-07 ENCOUNTER — Ambulatory Visit (HOSPITAL_BASED_OUTPATIENT_CLINIC_OR_DEPARTMENT_OTHER): Payer: PPO

## 2022-11-07 DIAGNOSIS — I25118 Atherosclerotic heart disease of native coronary artery with other forms of angina pectoris: Secondary | ICD-10-CM | POA: Diagnosis not present

## 2022-11-07 LAB — ECHOCARDIOGRAM COMPLETE
AR max vel: 1.7 cm2
AV Area VTI: 1.66 cm2
AV Area mean vel: 1.61 cm2
AV Mean grad: 6.5 mmHg
AV Peak grad: 12 mmHg
Ao pk vel: 1.74 m/s
Area-P 1/2: 2.62 cm2
S' Lateral: 1.47 cm

## 2022-11-11 DIAGNOSIS — N1832 Chronic kidney disease, stage 3b: Secondary | ICD-10-CM | POA: Diagnosis not present

## 2022-11-12 ENCOUNTER — Encounter: Payer: Self-pay | Admitting: Nurse Practitioner

## 2022-11-12 ENCOUNTER — Ambulatory Visit: Payer: PPO | Attending: Nurse Practitioner | Admitting: Nurse Practitioner

## 2022-11-12 VITALS — BP 128/60 | HR 57 | Ht 59.0 in | Wt 123.4 lb

## 2022-11-12 DIAGNOSIS — E118 Type 2 diabetes mellitus with unspecified complications: Secondary | ICD-10-CM | POA: Diagnosis not present

## 2022-11-12 DIAGNOSIS — E785 Hyperlipidemia, unspecified: Secondary | ICD-10-CM

## 2022-11-12 DIAGNOSIS — Z794 Long term (current) use of insulin: Secondary | ICD-10-CM

## 2022-11-12 DIAGNOSIS — I2510 Atherosclerotic heart disease of native coronary artery without angina pectoris: Secondary | ICD-10-CM | POA: Diagnosis not present

## 2022-11-12 DIAGNOSIS — D649 Anemia, unspecified: Secondary | ICD-10-CM | POA: Diagnosis not present

## 2022-11-12 DIAGNOSIS — R0609 Other forms of dyspnea: Secondary | ICD-10-CM | POA: Diagnosis not present

## 2022-11-12 DIAGNOSIS — I10 Essential (primary) hypertension: Secondary | ICD-10-CM

## 2022-11-12 DIAGNOSIS — I6523 Occlusion and stenosis of bilateral carotid arteries: Secondary | ICD-10-CM | POA: Diagnosis not present

## 2022-11-12 NOTE — Patient Instructions (Signed)
Medication Instructions:  Your physician recommends that you continue on your current medications as directed. Please refer to the Current Medication list given to you today.  *If you need a refill on your cardiac medications before your next appointment, please call your pharmacy*   Lab Work: NONE ordered at this time of appointment     Testing/Procedures: Your physician has requested that you have a carotid duplex. This test is an ultrasound of the carotid arteries in your neck. It looks at blood flow through these arteries that supply the brain with blood. Allow one hour for this exam. There are no restrictions or special instructions.    Follow-Up: At [facility], you and your health needs are our priority.  As part of our continuing mission to provide you with exceptional heart care, we have created designated Provider Care Teams.  These Care Teams include your primary Cardiologist (physician) and Advanced Practice Providers (APPs -  Physician Assistants and Nurse Practitioners) who all work together to provide you with the care you need, when you need it.  We recommend signing up for the patient portal called "MyChart".  Sign up information is provided on this After Visit Summary.  MyChart is used to connect with patients for Virtual Visits (Telemedicine).  Patients are able to view lab/test results, encounter notes, upcoming appointments, etc.  Non-urgent messages can be sent to your provider as well.   To learn more about what you can do with MyChart, go to [email or web address].    Your next appointment:    Keep follow up   Provider:   Nanetta Batty, MD

## 2022-11-12 NOTE — Progress Notes (Signed)
Office Visit    Patient Name: Kimberly Rivers Date of Encounter: 11/12/2022  Primary Care Provider:  Jarrett Soho, PA-C Primary Cardiologist:  Nanetta Batty, MD  Chief Complaint    72 year old female with a history of CAD s/p DES x 2-mLAD in 2010 in [place], carotid artery disease, hypertension, hyperlipidemia, and type 2 diabetes who presents for follow-up related to CAD.    Past Medical History    Past Medical History:  Diagnosis Date   Cataract    OD   Chronic kidney disease    Diabetes mellitus without complication (HCC)    Hypertension    Hypertensive retinopathy    OU   Macular degeneration    Dry OU   Past Surgical History:  Procedure Laterality Date   CATARACT EXTRACTION Left    EYE SURGERY Left    Cat Sx    Allergies  Allergies  Allergen Reactions   Clindamycin/Lincomycin    Doxycycline    Penicillins      Labs/Other Studies Reviewed    The following studies were reviewed today:  Cardiac Studies & Procedures     STRESS TESTS  NM PET CT CARDIAC PERFUSION MULTI W/ABSOLUTE BLOODFLOW 10/28/2022  Narrative   The study is normal. The study is low risk.   LV perfusion is normal. There is no evidence of ischemia. There is no evidence of infarction.   Rest left ventricular function is normal. Rest EF: 65%. Stress EF: 75%. End diastolic cavity size is normal. End systolic cavity size is normal.   Myocardial blood flow was computed to be 0.42ml/g/min at rest and 2.39ml/g/min at stress. Global myocardial blood flow reserve was 2.75 and was normal.   Coronary calcium assessment not performed due to prior revascularization.   Electronically Signed: Thurmon Fair, MD  EXAM: OVER-READ INTERPRETATION  CT CHEST  The following report is a limited chest CT over-read performed by radiologist Dr. Irma Newness [facility] on 10/28/2022. This over-read does not include interpretation of cardiac or coronary anatomy or pathology nor does it  include evaluation of the PET data. The cardiac PET-CT interpretation by the cardiologist is attached.  COMPARISON:  None.  FINDINGS: Mediastinum/Nodes: No enlarged lymph nodes within the visualized mediastinum.Aortic atherosclerosis with possible calcifications of the aortic valve.  Lungs/Pleura: No pleural effusion or basilar pneumothorax. Tiny calcified left lower lobe granuloma and mild linear atelectasis or scarring at the left lung base. The visualized lung bases are otherwise clear.  Upper abdomen: The spleen is incompletely visualized, although appears prominent, measuring up to 12.9 cm in length. Probable cyst peripherally in the right hepatic lobe.  Musculoskeletal/Chest wall: No chest wall mass or suspicious osseous findings within the visualized chest. Thoracolumbar scoliosis with evidence of previous multilevel posterior thoracic surgical fusion.  IMPRESSION: 1. No acute extra cardiac findings identified. 2. The spleen is incompletely visualized, although appears prominent. 3. Thoracolumbar scoliosis with evidence of previous multilevel posterior thoracic surgical fusion. 4.  Aortic Atherosclerosis (ICD10-I70.0).   Electronically Signed By: Carey Bullocks M.D. On: 10/28/2022 13:37   ECHOCARDIOGRAM  ECHOCARDIOGRAM COMPLETE 11/07/2022  Narrative ECHOCARDIOGRAM REPORT    [name] Date of Exam: 11/07/2022 Medical Rec #:  [id number]          Height:       59.0 in Accession #:    [id number]         Weight:       127.2 lb Date of Birth:  January 29, 1951  BSA:          1.522 m Patient Age:    71 years           BP:           132/52 mmHg Patient Gender: F                  HR:           48 bpm. Exam Location:  Outpatient  Procedure: 2D Echo, 3D Echo, Color Doppler and Cardiac Doppler  Indications:    R06.9 DOE  History:        Patient has prior history of Echocardiogram examinations, most recent 01/18/2021. CAD,  Signs/Symptoms:Dyspnea; Risk Factors:Hypertension, Dyslipidemia and Non-Smoker. Patient denies chest pain and leg edema. She does have DOE.  Sonographer:    Carlos American RVT, RDCS (AE), RDMS Referring Phys: [id number] JONATHAN J BERRY  IMPRESSIONS   1. Left ventricular ejection fraction, by estimation, is 70 to 75%. Left ventricular ejection fraction by 3D volume is 73 %. The left ventricle has hyperdynamic function. The left ventricle has no regional wall motion abnormalities. There is moderate asymmetric left ventricular hypertrophy of the basal-septal segment. Left ventricular diastolic parameters are consistent with Grade I diastolic dysfunction (impaired relaxation). 2. Right ventricular systolic function is hyperdynamic. The right ventricular size is normal. 3. Left atrial size was moderately dilated. 4. The mitral valve is normal in structure. No evidence of mitral valve regurgitation. No evidence of mitral stenosis. 5. The aortic valve is tricuspid. Aortic valve regurgitation is not visualized. No aortic stenosis is present. 6. The inferior vena cava is normal in size with greater than 50% respiratory variability, suggesting right atrial pressure of 3 mmHg.  Comparison(s): EF 65-70%, intraventricular gradient. Similar function and gradients.  FINDINGS Left Ventricle: There is a small < 30 mm HG intracavitary gradient with Valsalva. Left ventricular ejection fraction, by estimation, is 70 to 75%. Left ventricular ejection fraction by 3D volume is 73 %. The left ventricle has hyperdynamic function. The left ventricle has no regional wall motion abnormalities. The left ventricular internal cavity size was normal in size. There is moderate asymmetric left ventricular hypertrophy of the basal-septal segment. Left ventricular diastolic parameters are consistent with Grade I diastolic dysfunction (impaired relaxation).  Right Ventricle: The right ventricular size is normal. No increase in right  ventricular wall thickness. Right ventricular systolic function is hyperdynamic.  Left Atrium: Left atrial size was moderately dilated.  Right Atrium: Right atrial size was normal in size.  Pericardium: Trivial pericardial effusion is present. The pericardial effusion is lateral to the left ventricle.  Mitral Valve: The mitral valve is normal in structure. No evidence of mitral valve regurgitation. No evidence of mitral valve stenosis.  Tricuspid Valve: The tricuspid valve is normal in structure. Tricuspid valve regurgitation is trivial. No evidence of tricuspid stenosis.  Aortic Valve: The aortic valve is tricuspid. Aortic valve regurgitation is not visualized. No aortic stenosis is present. Aortic valve mean gradient measures 6.5 mmHg. Aortic valve peak gradient measures 12.0 mmHg. Aortic valve area, by VTI measures 1.66 cm.  Pulmonic Valve: The pulmonic valve was normal in structure. Pulmonic valve regurgitation is mild. No evidence of pulmonic stenosis.  Aorta: The aortic root and ascending aorta are structurally normal, with no evidence of dilitation.  Venous: The inferior vena cava is normal in size with greater than 50% respiratory variability, suggesting right atrial pressure of 3 mmHg.  IAS/Shunts: No atrial level shunt detected by color flow Doppler.  LEFT VENTRICLE PLAX 2D LVIDd:         3.82 cm         Diastology LVIDs:         1.47 cm         LV e' medial:    5.98 cm/s LV PW:         1.03 cm         LV E/e' medial:  15.2 LV IVS:        1.10 cm         LV e' lateral:   6.85 cm/s LVOT diam:     1.60 cm         LV E/e' lateral: 13.2 LV SV:         65 LV SV Index:   43 LVOT Area:     2.01 cm        3D Volume EF LV 3D EF:    Left ventricul ar ejection fraction by 3D volume is 73 %.  3D Volume EF: 3D EF:        73 % LV EDV:       101 ml LV ESV:       28 ml LV SV:        73 ml  RIGHT VENTRICLE RV S prime:     13.30 cm/s TAPSE (M-mode): 2.6 cm  LEFT ATRIUM              Index        RIGHT ATRIUM           Index LA diam:        3.30 cm 2.17 cm/m   RA Area:     16.00 cm LA Vol (A2C):   84.7 ml 55.66 ml/m  RA Volume:   44.10 ml  28.98 ml/m LA Vol (A4C):   44.4 ml 29.18 ml/m LA Biplane Vol: 62.4 ml 41.01 ml/m AORTIC VALVE                     PULMONIC VALVE AV Area (Vmax):    1.70 cm      PV Vmax:          1.31 m/s AV Area (Vmean):   1.61 cm      PV Peak grad:     6.8 mmHg AV Area (VTI):     1.66 cm      PR End Diast Vel: 2.24 msec AV Vmax:           173.50 cm/s AV Vmean:          118.500 cm/s AV VTI:            0.392 m AV Peak Grad:      12.0 mmHg AV Mean Grad:      6.5 mmHg LVOT Vmax:         147.00 cm/s LVOT Vmean:        94.800 cm/s LVOT VTI:          0.323 m LVOT/AV VTI ratio: 0.82  AORTA Ao Root diam: 2.70 cm Ao Asc diam:  2.80 cm Ao Arch diam: 2.6 cm  MITRAL VALVE                TRICUSPID VALVE MV Area (PHT): 2.62 cm     TR Peak grad:   31.8 mmHg MV Decel Time: 289 msec     TR Vmax:        282.00 cm/s MV E  velocity: 90.70 cm/s MV A velocity: 120.00 cm/s  SHUNTS MV E/A ratio:  0.76         Systemic VTI:  0.32 m Systemic Diam: 1.60 cm  Riley Lam MD Electronically signed by Riley Lam MD Signature Date/Time: 11/07/2022/2:51:25 PM    Final            Recent Labs: 03/05/2022: ALT 14  Recent Lipid Panel    Component Value Date/Time   CHOL 152 03/05/2022 0901   TRIG 49 03/05/2022 0901   HDL 81 03/05/2022 0901   CHOLHDL 1.9 03/05/2022 0901   LDLCALC 60 03/05/2022 0901    History of Present Illness    73 year old female with with the above past medical history including CAD s/p DES x 2-mLAD in 2010 in [place], carotid artery disease, hypertension, hyperlipidemia, and type 2 diabetes.    She has a history of CAD s/p DES x 2-LAD in February 2010 in [place].  Myoview in 2019 was nonischemic.  Echocardiogram May 2022 showed EF 65 to 70%, normal LV function, no RWMA, G1 DD, normal RV, no  significant valvular abnormalities.  Carotid ultrasound in 12/2021 revealed 1 to 39% R ICA stenosis, 40 to 59% LICA stenosis.  Follow-up study was recommended in 1 year. ABIs in 04/2022 were essentially normal.  She was last seen in the office on 09/17/2018 for noted dyspnea on exertion.  Cardiac PET stress test was low risk.  Repeat echocardiogram showed EF 70 to 75%, hyperdynamic LV function, no RWMA, moderate asymmetric LVH, G1 DD, hyperdynamic RV, no significant valvular abnormalities no significant change from prior study.  Repeat echocardiogram was recommended in 1 year.   She presents today for follow-up.  Since her last visit she has been stable from a cardiac standpoint.  She continues to note ongoing dyspnea with exertion and mild fatigue.  She notes that she had labs drawn by her nephrologist, Dr. Vallery Sa, with [facility] and noted that she was anemic.  She does have a history of anemia and states she previously followed with a hematologist in [place].  She denies any chest pain, palpitations, dizziness, edema, PND, orthopnea, weight gain.  Other than her ongoing dyspnea and fatigue, she denies any additional concerns today.  Home Medications    Current Outpatient Medications  Medication Sig Dispense Refill   aspirin EC 81 MG tablet Take 81 mg by mouth daily.     atorvastatin (LIPITOR) 80 MG tablet Take 1 tablet (80 mg total) by mouth daily. 90 tablet 3   Carboxymethylcellulose Sodium (EYE DROPS OP) Apply to eye. Refresh Drops as needed before bed     Carboxymethylcellulose Sodium (THERATEARS OP) Apply to eye in the morning.     citalopram (CELEXA) 20 MG tablet Take 20 mg by mouth daily.     CRANBERRY PO Take 4,200 capsules by mouth daily.     enalapril (VASOTEC) 5 MG tablet Take 1 tablet (5 mg total) by mouth daily. 90 tablet 3   Ergocalciferol (VITAMIN D2 PO) Take by mouth. 2-3 times a week     Ferrous Sulfate (IRON) 325 (65 Fe) MG TABS Take 1 tablet by mouth daily.      gabapentin (NEURONTIN) 300 MG capsule Take 300 mg by mouth at bedtime.      Lactobacillus (PROBIOTIC ACIDOPHILUS PO) Take by mouth.     lubiprostone (AMITIZA) 24 MCG capsule Take 24 mcg by mouth 2 (two) times daily.     NIFEdipine (PROCARDIA XL/NIFEDICAL XL) 60 MG  24 hr tablet Take 1 tablet (60 mg total) by mouth daily. 90 tablet 3   oxybutynin (DITROPAN-XL) 5 MG 24 hr tablet Take 5 mg by mouth at bedtime.     repaglinide (PRANDIN) 0.5 MG tablet      vitamin C (ASCORBIC ACID) 500 MG tablet Take 500 mg by mouth daily.     No current facility-administered medications for this visit.     Review of Systems    She denies chest pain, palpitations, pnd, orthopnea, n, v, dizziness, syncope, edema, weight gain, or early satiety. All other systems reviewed and are otherwise negative except as noted above.   Physical Exam    VS:  BP 128/60 (BP Location: Left Arm, Patient Position: Sitting, Cuff Size: Normal)   Pulse (!) 57   Ht 4\' 11"  (1.499 m)   Wt 123 lb 6.4 oz (56 kg)   SpO2 97%   BMI 24.92 kg/m  GEN: Well nourished, well developed, in no acute distress. HEENT: normal. Neck: Supple, no JVD, carotid bruits, or masses. Cardiac: RRR, no murmurs, rubs, or gallops. No clubbing, cyanosis, edema.  Radials/DP/PT 2+ and equal bilaterally.  Respiratory:  Respirations regular and unlabored, clear to auscultation bilaterally. GI: Soft, nontender, nondistended, BS + x 4. MS: no deformity or atrophy. Skin: warm and dry, no rash. Neuro:  Strength and sensation are intact. Psych: Normal affect.  Accessory Clinical Findings    ECG personally reviewed by me today -    - no EKG in office today.   No results found for: "WBC", "HGB", "HCT", "MCV", "PLT" No results found for: "CREATININE", "BUN", "NA", "K", "CL", "CO2" Lab Results  Component Value Date   ALT 14 03/05/2022   AST 15 03/05/2022   ALKPHOS 50 03/05/2022   BILITOT 0.4 03/05/2022   Lab Results  Component Value Date   CHOL 152 03/05/2022    HDL 81 03/05/2022   LDLCALC 60 03/05/2022   TRIG 49 03/05/2022   CHOLHDL 1.9 03/05/2022    No results found for: "HGBA1C"  Assessment & Plan    1. CAD/dyspnea on exertion/fatigue: S/p DES x 2-LAD in February 2010.  Myoview in 2019 was nonischemic. Echocardiogram May 2022 showed EF 65 to 70%, normal LV function, no RWMA, G1 DD, normal RV, no significant valvular abnormalities.  48-month history of dyspnea on exertion, fatigue.  She denies chest pain, palpitations, edema, PND, orthopnea, weight gain.  Euvolemic and well compensated on exam.  Recent cardiac PET stress test was negative for ischemia, repeat echo was stable.  She notes she had labs drawn with her nephrologist which showed some anemia.  She does have a history of anemia and previously followed with hematology.  Will request copy of most recent labs.  Recommend follow-up with PCP as I suspect anemia could be contributing to her symptoms as above given reassuring cardiac workup.  Continue aspirin, enalapril, nifedipine, and Lipitor.   2. Carotid artery disease:Carotid ultrasound in 12/2021 revealed 1 to 39% R ICA stenosis, 40 to 59% LICA stenosis.  Follow-up study was recommended in 1 year (12/2022), will order.  Continue aspirin, Lipitor.   3. Hypertension: BP well controlled. Continue current antihypertensive regimen.    4. Hyperlipidemia: LDL was 60 in 02/2022.  Continue Lipitor.   5. Type 2 diabetes: A1c was 6.3 in 03/2022. Monitored and manged per PCP.    6.  Anemia: She had labs drawn recently per her nephrologist and was told she was anemic.  Will request copy of most recent labs.  In the setting of ongoing dyspnea and reassuring cardiac workup, recommend follow-up with PCP (patient previously followed with hematology in [place]).  7. Disposition: Follow-up as scheduled with Dr. Allyson Sabal in 12/2022.      Joylene Grapes, NP 11/12/2022, 9:23 AM

## 2022-11-18 ENCOUNTER — Other Ambulatory Visit (HOSPITAL_COMMUNITY): Payer: PPO

## 2022-11-19 DIAGNOSIS — I2510 Atherosclerotic heart disease of native coronary artery without angina pectoris: Secondary | ICD-10-CM | POA: Diagnosis not present

## 2022-11-19 DIAGNOSIS — R0602 Shortness of breath: Secondary | ICD-10-CM | POA: Diagnosis not present

## 2022-11-19 DIAGNOSIS — I10 Essential (primary) hypertension: Secondary | ICD-10-CM | POA: Diagnosis not present

## 2022-11-19 DIAGNOSIS — F3341 Major depressive disorder, recurrent, in partial remission: Secondary | ICD-10-CM | POA: Diagnosis not present

## 2022-11-19 DIAGNOSIS — E785 Hyperlipidemia, unspecified: Secondary | ICD-10-CM | POA: Diagnosis not present

## 2022-11-19 DIAGNOSIS — E1169 Type 2 diabetes mellitus with other specified complication: Secondary | ICD-10-CM | POA: Diagnosis not present

## 2022-11-19 DIAGNOSIS — Z6825 Body mass index (BMI) 25.0-25.9, adult: Secondary | ICD-10-CM | POA: Diagnosis not present

## 2022-11-19 DIAGNOSIS — Z9861 Coronary angioplasty status: Secondary | ICD-10-CM | POA: Diagnosis not present

## 2022-11-19 DIAGNOSIS — N3281 Overactive bladder: Secondary | ICD-10-CM | POA: Diagnosis not present

## 2022-11-19 DIAGNOSIS — D508 Other iron deficiency anemias: Secondary | ICD-10-CM | POA: Diagnosis not present

## 2022-11-19 DIAGNOSIS — N1832 Chronic kidney disease, stage 3b: Secondary | ICD-10-CM | POA: Diagnosis not present

## 2022-11-19 DIAGNOSIS — E1122 Type 2 diabetes mellitus with diabetic chronic kidney disease: Secondary | ICD-10-CM | POA: Diagnosis not present

## 2022-11-19 DIAGNOSIS — I70203 Unspecified atherosclerosis of native arteries of extremities, bilateral legs: Secondary | ICD-10-CM | POA: Diagnosis not present

## 2022-11-20 DIAGNOSIS — N1832 Chronic kidney disease, stage 3b: Secondary | ICD-10-CM | POA: Diagnosis not present

## 2022-11-20 DIAGNOSIS — I129 Hypertensive chronic kidney disease with stage 1 through stage 4 chronic kidney disease, or unspecified chronic kidney disease: Secondary | ICD-10-CM | POA: Diagnosis not present

## 2022-11-20 DIAGNOSIS — R001 Bradycardia, unspecified: Secondary | ICD-10-CM | POA: Diagnosis not present

## 2022-11-20 DIAGNOSIS — E1122 Type 2 diabetes mellitus with diabetic chronic kidney disease: Secondary | ICD-10-CM | POA: Diagnosis not present

## 2022-11-20 DIAGNOSIS — I2510 Atherosclerotic heart disease of native coronary artery without angina pectoris: Secondary | ICD-10-CM | POA: Diagnosis not present

## 2022-11-26 ENCOUNTER — Ambulatory Visit (HOSPITAL_COMMUNITY)
Admission: RE | Admit: 2022-11-26 | Discharge: 2022-11-26 | Disposition: A | Discharge status: Home / Self Care | Payer: PPO | Source: Ambulatory Visit | Attending: Nurse Practitioner | Admitting: Nurse Practitioner

## 2022-11-26 DIAGNOSIS — I6523 Occlusion and stenosis of bilateral carotid arteries: Secondary | ICD-10-CM | POA: Insufficient documentation

## 2022-12-24 ENCOUNTER — Ambulatory Visit: Payer: PPO | Admitting: Podiatry

## 2022-12-24 DIAGNOSIS — D649 Anemia, unspecified: Secondary | ICD-10-CM | POA: Diagnosis not present

## 2023-01-01 ENCOUNTER — Other Ambulatory Visit: Payer: Self-pay | Admitting: Cardiovascular Disease

## 2023-01-01 DIAGNOSIS — E782 Mixed hyperlipidemia: Secondary | ICD-10-CM

## 2023-01-05 ENCOUNTER — Encounter: Payer: Self-pay | Admitting: Cardiovascular Disease

## 2023-01-05 ENCOUNTER — Ambulatory Visit: Payer: PPO | Attending: Cardiovascular Disease | Admitting: Cardiovascular Disease

## 2023-01-05 VITALS — BP 116/40 | HR 56 | Ht 59.0 in | Wt 124.0 lb

## 2023-01-05 DIAGNOSIS — I421 Obstructive hypertrophic cardiomyopathy: Secondary | ICD-10-CM | POA: Insufficient documentation

## 2023-01-05 DIAGNOSIS — I10 Essential (primary) hypertension: Secondary | ICD-10-CM

## 2023-01-05 DIAGNOSIS — I2510 Atherosclerotic heart disease of native coronary artery without angina pectoris: Secondary | ICD-10-CM

## 2023-01-05 DIAGNOSIS — E782 Mixed hyperlipidemia: Secondary | ICD-10-CM | POA: Diagnosis not present

## 2023-01-05 DIAGNOSIS — I6522 Occlusion and stenosis of left carotid artery: Secondary | ICD-10-CM | POA: Diagnosis not present

## 2023-01-05 NOTE — Assessment & Plan Note (Signed)
History of carotid artery disease with recent Doppler performed 11/26/2022 revealing moderate left ICA stenosis.  This will be repeated on an annual basis.

## 2023-01-05 NOTE — Assessment & Plan Note (Signed)
History of essential hypertension blood pressure measured today at 116/40.  She is on nifedipine, and enalapril.

## 2023-01-05 NOTE — Assessment & Plan Note (Signed)
History of hyperlipidemia on high-dose atorvastatin with lipid profile performed 03/05/2021 revealing total cholesterol 152, LDL 60 and HDL of 81.

## 2023-01-05 NOTE — Assessment & Plan Note (Signed)
History of CAD status post stenting at [facility] in 2010 with a Taxus drug-eluting stent and 2 Endeavor stents 2/29/2010.  She did have a cardiac PET study performed 10/28/2022 which was entirely normal as was a 2D echocardiogram.

## 2023-01-05 NOTE — Progress Notes (Signed)
01/05/2023 YONG HAASE   11/27/1950  [id number]  Primary Physician Jarrett Soho, PA-C Primary Cardiologist: Runell Gess MD Nicholes Calamity, [place]  HPI:  Kimberly Rivers is a 72 y.o.    mildly overweight single Caucasian female with no children who relocated from the [place] to [facility] because of retirement and cost of living.  She was referred by Dr. Susa Simmonds, her PCP, to be established in my practice for ongoing cardiovascular care.  I last saw her in the office 01/01/2022. 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  Risk factors include treated hypertension, diabetes and lipidemia.  Her mother did have a CABG.  She is never had a heart attack or stroke.  She had stenting at [facility] before 2010 with a Taxus express drug-eluting stent and then stenting 2/29/2010 with 2 Endeavor stents in her LAD.  She had a normal 2D echo and Myoview stress test by her cardiologist in [place] 08/26/2017.    Since I saw her a year and a half ago she continues to do well.  She denies chest pain but does complain of some dyspnea on exertion.  She had several diagnostic test since I last saw her including a cardiac PET study performed 10/28/2022 which was entirely normal as well as a 2D echocardiogram.  Performed 11/07/2022.  She does have moderate asymmetric left ventricular hypertrophy .     Current Meds  Medication Sig   aspirin EC 81 MG tablet Take 81 mg by mouth daily.   atorvastatin (LIPITOR) 80 MG tablet Take 1 tablet by mouth once daily   Carboxymethylcellulose Sodium (EYE DROPS OP) Apply to eye. Refresh Drops as needed before bed   Carboxymethylcellulose Sodium (THERATEARS OP) Apply to eye in the morning.   Cholecalciferol (VITAMIN D-3 PO) Take 1 Capful by mouth daily.   citalopram (CELEXA) 20 MG tablet Take 20 mg by mouth daily.   CRANBERRY PO Take 4,200 capsules by mouth daily.   enalapril (VASOTEC) 5 MG tablet Take 1 tablet (5 mg total) by mouth  daily.   Ferrous Sulfate (IRON) 325 (65 Fe) MG TABS Take 1 tablet by mouth daily.   gabapentin (NEURONTIN) 300 MG capsule Take 300 mg by mouth at bedtime.    Lactobacillus (PROBIOTIC ACIDOPHILUS PO) Take by mouth.   lubiprostone (AMITIZA) 24 MCG capsule Take 24 mcg by mouth 2 (two) times daily.   NIFEdipine (PROCARDIA XL/NIFEDICAL XL) 60 MG 24 hr tablet Take 1 tablet (60 mg total) by mouth daily.   oxybutynin (DITROPAN-XL) 5 MG 24 hr tablet Take 5 mg by mouth at bedtime.   repaglinide (PRANDIN) 0.5 MG tablet    vitamin C (ASCORBIC ACID) 500 MG tablet Take 500 mg by mouth daily.     Allergies  Allergen Reactions   Clindamycin/Lincomycin    Doxycycline    Penicillins     Social History   Socioeconomic History   Marital status: Single    Spouse name: Not on file   Number of children: Not on file   Years of education: Not on file   Highest education level: Not on file  Occupational History   Not on file  Tobacco Use   Smoking status: Never   Smokeless tobacco: Never  Vaping Use   Vaping status: Never Used  Substance and Sexual Activity   Alcohol use: Not Currently   Drug use: Never   Sexual activity: Not Currently  Other Topics Concern  Not on file  Social History Narrative   Not on file   Social Determinants of Health   Financial Resource Strain: Not on file  Food Insecurity: Not on file  Transportation Needs: Not on file  Physical Activity: Not on file  Stress: Not on file  Social Connections: Not on file  Intimate Partner Violence: Not on file     Review of Systems: General: negative for chills, fever, night sweats or weight changes.  Cardiovascular: negative for chest pain, dyspnea on exertion, edema, orthopnea, palpitations, paroxysmal nocturnal dyspnea or shortness of breath Dermatological: negative for rash Respiratory: negative for cough or wheezing Urologic: negative for hematuria Abdominal: negative for nausea, vomiting, diarrhea, bright red blood per  rectum, melena, or hematemesis Neurologic: negative for visual changes, syncope, or dizziness All other systems reviewed and are otherwise negative except as noted above.    Blood pressure (!) 116/40, pulse (!) 56, height 4\' 11"  (1.499 m), weight 124 lb (56.2 kg).  General appearance: alert and no distress Neck: no adenopathy, no carotid bruit, no JVD, supple, symmetrical, trachea midline, and thyroid not enlarged, symmetric, no tenderness/mass/nodules Lungs: clear to auscultation bilaterally Heart: regular rate and rhythm, S1, S2 normal, no murmur, click, rub or gallop Extremities: extremities normal, atraumatic, no cyanosis or edema Pulses: 2+ and symmetric Skin: Skin color, texture, turgor normal. No rashes or lesions Neurologic: Grossly normal  EKG not performed today      ASSESSMENT AND PLAN:   Essential hypertension History of essential hypertension blood pressure measured today at 116/40.  She is on nifedipine, and enalapril.  Hyperlipidemia History of hyperlipidemia on high-dose atorvastatin with lipid profile performed 03/05/2021 revealing total cholesterol 152, LDL 60 and HDL of 81.  Coronary artery disease History of CAD status post stenting at [facility] in 2010 with a Taxus drug-eluting stent and 2 Endeavor stents 2/29/2010.  She did have a cardiac PET study performed 10/28/2022 which was entirely normal as was a 2D echocardiogram.  Carotid artery disease (HCC) History of carotid artery disease with recent Doppler performed 11/26/2022 revealing moderate left ICA stenosis.  This will be repeated on an annual basis.  Obstructive hypertrophic cardiomyopathy (HCC) 2D echo performed 8/24 showed moderate asymmetric septal hypertrophy with a small outflow tract gradient.  She does complain of shortness of breath.  It is not clear whether this is contributory.  I am going to refer her to Dr.Chandrasekhar for further evaluation.     Runell Gess MD FACP,FACC,FAHA,  [facility] 01/05/2023 2:58 PM

## 2023-01-05 NOTE — Patient Instructions (Addendum)
Medication Instructions:  Your physician recommends that you continue on your current medications as directed. Please refer to the Current Medication list given to you today.  *If you need a refill on your cardiac medications before your next appointment, please call your pharmacy*   Testing/Procedures: Your physician has requested that you have a carotid duplex. This test is an ultrasound of the carotid arteries in your neck. It looks at blood flow through these arteries that supply the brain with blood. Allow one hour for this exam. There are no restrictions or special instructions. This will take place at [address] [facility] [address], [address]. To do in September 2025.    Follow-Up: At [facility], you and your health needs are our priority.  As part of our continuing mission to provide you with exceptional heart care, we have created designated Provider Care Teams.  These Care Teams include your primary Cardiologist (physician) and Advanced Practice Providers (APPs -  Physician Assistants and Nurse Practitioners) who all work together to provide you with the care you need, when you need it.  We recommend signing up for the patient portal called "MyChart".  Sign up information is provided on this After Visit Summary.  MyChart is used to connect with patients for Virtual Visits (Telemedicine).  Patients are able to view lab/test results, encounter notes, upcoming appointments, etc.  Non-urgent messages can be sent to your provider as well.   To learn more about what you can do with MyChart, go to [email or web address].    Your next appointment:   6 month(s)  Provider:   Bernadene Person, NP       Then, Nanetta Batty, MD will plan to see you again in 12 month(s).

## 2023-01-05 NOTE — Assessment & Plan Note (Signed)
2D echo performed 8/24 showed moderate asymmetric septal hypertrophy with a small outflow tract gradient.  She does complain of shortness of breath.  It is not clear whether this is contributory.  I am going to refer her to Dr.Chandrasekhar for further evaluation.

## 2023-01-08 DIAGNOSIS — D649 Anemia, unspecified: Secondary | ICD-10-CM | POA: Diagnosis not present

## 2023-01-14 ENCOUNTER — Encounter: Payer: Self-pay | Admitting: Pulmonary Disease

## 2023-01-14 ENCOUNTER — Ambulatory Visit: Payer: PPO | Admitting: Pulmonary Disease

## 2023-01-14 ENCOUNTER — Ambulatory Visit: Payer: PPO

## 2023-01-14 VITALS — BP 119/71 | HR 60 | Ht 59.0 in | Wt 123.2 lb

## 2023-01-14 DIAGNOSIS — R0602 Shortness of breath: Secondary | ICD-10-CM

## 2023-01-14 NOTE — Patient Instructions (Signed)
Obtain chest x-ray today  Schedule for pulmonary function test  Graded activities as tolerated  Lung restriction from scoliosis, restriction may contribute to shortness of breath however, since you have had no acute change, not sure at this is what is causing the problem.  We may need to consider chest CT scan depending on findings on the x-ray and breathing study  Follow-up in about 6 to 8 weeks

## 2023-01-14 NOTE — Progress Notes (Signed)
Kimberly Rivers    [id number]    July 12, 1950  Primary Care Physician:Wharton, Rulon Eisenmenger  Referring Physician: Jarrett Soho, PA-C [address] [place],  [place] [address]  Chief complaint:   Patient being evaluated for shortness of breath  HPI:  Shortness of breath worsening in the last 2 to 3 months Does not recollect having any acute problems leading to worsening breathing  Differential is as simple as going to the store has become challenging in the last few months  Does not have any chest pain or chest discomfort No chronic cough, occasional cough  She does have chronic anemia, does not recollect being told that there has been an acute drop  Echocardiogram recently did show diastolic dysfunction  History of scoliosis for which she had spinal fusion when she was about 72 years old  She does have a history of hypertension, diabetes, hypercholesterolemia, history of coronary artery disease for which she had a stent in 2010  Does have osteoarthritis  Has not had any acute back pain or discomfort  She does have a cat for a pet and she has had the cat for few years  She is a retired Comptroller  No significant exposure to secondhand smoke known to her  Lived in [place] most of her life  Outpatient Encounter Medications as of 01/14/2023  Medication Sig   aspirin EC 81 MG tablet Take 81 mg by mouth daily.   atorvastatin (LIPITOR) 80 MG tablet Take 1 tablet by mouth once daily   Carboxymethylcellulose Sodium (EYE DROPS OP) Apply to eye. Refresh Drops as needed before bed   Carboxymethylcellulose Sodium (THERATEARS OP) Apply to eye in the morning.   Cholecalciferol (VITAMIN D-3 PO) Take 1 Capful by mouth daily.   citalopram (CELEXA) 20 MG tablet Take 20 mg by mouth daily.   CRANBERRY PO Take 4,200 capsules by mouth daily.   enalapril (VASOTEC) 5 MG tablet Take 1 tablet (5 mg total) by mouth daily.   Ferrous Sulfate (IRON) 325 (65 Fe) MG  TABS Take 1 tablet by mouth daily.   gabapentin (NEURONTIN) 300 MG capsule Take 300 mg by mouth at bedtime.    Lactobacillus (PROBIOTIC ACIDOPHILUS PO) Take by mouth.   NIFEdipine (PROCARDIA XL/NIFEDICAL XL) 60 MG 24 hr tablet Take 1 tablet (60 mg total) by mouth daily.   oxybutynin (DITROPAN-XL) 5 MG 24 hr tablet Take 5 mg by mouth at bedtime.   repaglinide (PRANDIN) 0.5 MG tablet    vitamin C (ASCORBIC ACID) 500 MG tablet Take 500 mg by mouth daily.   [DISCONTINUED] lubiprostone (AMITIZA) 24 MCG capsule Take 24 mcg by mouth 2 (two) times daily.   No facility-administered encounter medications on file as of 01/14/2023.    Allergies as of 01/14/2023 - Review Complete 01/14/2023  Allergen Reaction Noted   Clindamycin/lincomycin  01/15/2018   Doxycycline  01/15/2018   Penicillins  01/15/2018    Past Medical History:  Diagnosis Date   Cataract    OD   Chronic kidney disease    Diabetes mellitus without complication (HCC)    Hypertension    Hypertensive retinopathy    OU   Macular degeneration    Dry OU    Past Surgical History:  Procedure Laterality Date   ANGIOPLASTY     CATARACT EXTRACTION Left    EYE SURGERY Left    Cat Sx   SPINAL FUSION     WRIST SURGERY  Family History  Problem Relation Age of Onset   Hypertension Mother    Heart disease Mother    Diabetes Mother    Canavan disease Father    Heart disease Maternal Grandmother    Heart disease Maternal Grandfather    Kidney failure Maternal Grandfather     Social History   Socioeconomic History   Marital status: Single    Spouse name: Not on file   Number of children: Not on file   Years of education: Not on file   Highest education level: Not on file  Occupational History   Not on file  Tobacco Use   Smoking status: Never   Smokeless tobacco: Never  Vaping Use   Vaping status: Never Used  Substance and Sexual Activity   Alcohol use: Not Currently   Drug use: Never   Sexual activity: Not  Currently  Other Topics Concern   Not on file  Social History Narrative   Not on file   Social Determinants of Health   Financial Resource Strain: Not on file  Food Insecurity: Not on file  Transportation Needs: Not on file  Physical Activity: Not on file  Stress: Not on file  Social Connections: Not on file  Intimate Partner Violence: Not on file    Review of Systems  Respiratory:  Positive for shortness of breath.     Vitals:   01/14/23 0851  BP: 119/71  Pulse: 60  SpO2: 98%     Physical Exam Constitutional:      Appearance: Normal appearance.  HENT:     Head: Normocephalic.     Mouth/Throat:     Mouth: Mucous membranes are moist.  Eyes:     General: No scleral icterus. Cardiovascular:     Rate and Rhythm: Normal rate and regular rhythm.     Heart sounds: No murmur heard.    No friction rub.  Pulmonary:     Effort: No respiratory distress.     Breath sounds: No stridor. No rhonchi.  Musculoskeletal:     Cervical back: No rigidity or tenderness.  Neurological:     Mental Status: She is alert.  Psychiatric:        Mood and Affect: Mood normal.    Data Reviewed: Echocardiogram reviewed  Cardiac PET results reviewed -CT scan of the chest part of the PET scan showed cardiomegaly, restrictive anatomy  Last office note by Dr. Gery Pray was reviewed  Assessment:  Shortness of breath  Diastolic dysfunction  Deconditioning may be playing a role  Obstructive hypertrophic cardiomyopathy -Will have further evaluation by cardiology  Plan/Recommendations: Shortness of breath on exertion  Graded exercise as tolerated  Obtain chest x-ray  Obtain pulmonary function test  May consider CT scan of the chest though the recent PET scan does not show significant abnormality that is of concern  Chronic anemia may contribute to shortness of breath, however, has not had any acute drop in hematocrit according to her  Follow-up in about 6 to 8 weeks   Virl Diamond MD  Pulmonary and Critical Care 01/14/2023, 9:05 AM  CC: Jarrett Soho, PA-C

## 2023-01-27 ENCOUNTER — Ambulatory Visit (HOSPITAL_BASED_OUTPATIENT_CLINIC_OR_DEPARTMENT_OTHER): Payer: PPO | Admitting: Pulmonary Disease

## 2023-01-27 DIAGNOSIS — R0602 Shortness of breath: Secondary | ICD-10-CM | POA: Diagnosis not present

## 2023-01-27 LAB — PULMONARY FUNCTION TEST
DL/VA % pred: 103 %
DL/VA: 4.4 ml/min/mmHg/L
DLCO cor % pred: 65 %
DLCO cor: 10.56 ml/min/mmHg
DLCO unc % pred: 65 %
DLCO unc: 10.56 ml/min/mmHg
FEF 25-75 Post: 1.62 L/s
FEF 25-75 Pre: 1.31 L/s
FEF2575-%Change-Post: 23 %
FEF2575-%Pred-Post: 113 %
FEF2575-%Pred-Pre: 91 %
FEV1-%Change-Post: 6 %
FEV1-%Pred-Post: 89 %
FEV1-%Pred-Pre: 84 %
FEV1-Post: 1.51 L
FEV1-Pre: 1.42 L
FEV1FVC-%Change-Post: 8 %
FEV1FVC-%Pred-Pre: 106 %
FEV6-%Change-Post: -1 %
FEV6-%Pred-Post: 82 %
FEV6-%Pred-Pre: 83 %
FEV6-Post: 1.76 L
FEV6-Pre: 1.78 L
FEV6FVC-%Pred-Post: 105 %
FEV6FVC-%Pred-Pre: 105 %
FVC-%Change-Post: -1 %
FVC-%Pred-Post: 77 %
FVC-%Pred-Pre: 78 %
FVC-Post: 1.76 L
FVC-Pre: 1.78 L
Post FEV1/FVC ratio: 86 %
Post FEV6/FVC ratio: 100 %
Pre FEV1/FVC ratio: 80 %
Pre FEV6/FVC Ratio: 100 %
RV % pred: 336 %
RV: 6.79 L
TLC % pred: 197 %
TLC: 8.52 L

## 2023-01-27 NOTE — Patient Instructions (Signed)
Full PFT Performed Today  

## 2023-01-27 NOTE — Progress Notes (Signed)
Full PFT Performed Today  

## 2023-02-04 ENCOUNTER — Encounter: Payer: Self-pay | Admitting: Emergency Medicine

## 2023-02-04 ENCOUNTER — Ambulatory Visit (INDEPENDENT_AMBULATORY_CARE_PROVIDER_SITE_OTHER): Payer: PPO

## 2023-02-04 ENCOUNTER — Other Ambulatory Visit: Payer: Self-pay | Admitting: Family Medicine

## 2023-02-04 ENCOUNTER — Inpatient Hospital Stay: Payer: PPO

## 2023-02-04 ENCOUNTER — Ambulatory Visit: Payer: PPO | Attending: Physician Assistant | Admitting: Emergency Medicine

## 2023-02-04 ENCOUNTER — Telehealth: Payer: Self-pay | Admitting: Medical Oncology

## 2023-02-04 ENCOUNTER — Other Ambulatory Visit: Payer: Self-pay | Admitting: Medical Oncology

## 2023-02-04 ENCOUNTER — Inpatient Hospital Stay: Payer: PPO | Attending: Internal Medicine | Admitting: Internal Medicine

## 2023-02-04 ENCOUNTER — Telehealth: Payer: Self-pay | Admitting: Cardiovascular Disease

## 2023-02-04 VITALS — BP 130/44 | HR 56 | Ht 59.0 in | Wt 123.0 lb

## 2023-02-04 VITALS — BP 132/48 | HR 46 | Temp 97.9°F | Resp 16 | Ht 59.0 in | Wt 121.9 lb

## 2023-02-04 DIAGNOSIS — Z79899 Other long term (current) drug therapy: Secondary | ICD-10-CM | POA: Diagnosis not present

## 2023-02-04 DIAGNOSIS — I2510 Atherosclerotic heart disease of native coronary artery without angina pectoris: Secondary | ICD-10-CM | POA: Diagnosis not present

## 2023-02-04 DIAGNOSIS — I421 Obstructive hypertrophic cardiomyopathy: Secondary | ICD-10-CM | POA: Diagnosis not present

## 2023-02-04 DIAGNOSIS — N189 Chronic kidney disease, unspecified: Secondary | ICD-10-CM | POA: Insufficient documentation

## 2023-02-04 DIAGNOSIS — R0609 Other forms of dyspnea: Secondary | ICD-10-CM | POA: Diagnosis not present

## 2023-02-04 DIAGNOSIS — E785 Hyperlipidemia, unspecified: Secondary | ICD-10-CM | POA: Diagnosis not present

## 2023-02-04 DIAGNOSIS — I10 Essential (primary) hypertension: Secondary | ICD-10-CM

## 2023-02-04 DIAGNOSIS — N1832 Chronic kidney disease, stage 3b: Secondary | ICD-10-CM

## 2023-02-04 DIAGNOSIS — I129 Hypertensive chronic kidney disease with stage 1 through stage 4 chronic kidney disease, or unspecified chronic kidney disease: Secondary | ICD-10-CM | POA: Insufficient documentation

## 2023-02-04 DIAGNOSIS — R001 Bradycardia, unspecified: Secondary | ICD-10-CM | POA: Diagnosis not present

## 2023-02-04 DIAGNOSIS — D638 Anemia in other chronic diseases classified elsewhere: Secondary | ICD-10-CM

## 2023-02-04 DIAGNOSIS — D631 Anemia in chronic kidney disease: Secondary | ICD-10-CM | POA: Insufficient documentation

## 2023-02-04 DIAGNOSIS — E1122 Type 2 diabetes mellitus with diabetic chronic kidney disease: Secondary | ICD-10-CM | POA: Diagnosis not present

## 2023-02-04 DIAGNOSIS — I6523 Occlusion and stenosis of bilateral carotid arteries: Secondary | ICD-10-CM | POA: Diagnosis not present

## 2023-02-04 DIAGNOSIS — Z7982 Long term (current) use of aspirin: Secondary | ICD-10-CM | POA: Insufficient documentation

## 2023-02-04 DIAGNOSIS — D649 Anemia, unspecified: Secondary | ICD-10-CM

## 2023-02-04 DIAGNOSIS — Z1231 Encounter for screening mammogram for malignant neoplasm of breast: Secondary | ICD-10-CM

## 2023-02-04 LAB — CBC WITH DIFFERENTIAL (CANCER CENTER ONLY)
Abs Immature Granulocytes: 0.01 10*3/uL (ref 0.00–0.07)
Basophils Absolute: 0 10*3/uL (ref 0.0–0.1)
Basophils Relative: 1 %
Eosinophils Absolute: 0.8 10*3/uL — ABNORMAL HIGH (ref 0.0–0.5)
Eosinophils Relative: 16 %
HCT: 31 % — ABNORMAL LOW (ref 36.0–46.0)
Hemoglobin: 10.5 g/dL — ABNORMAL LOW (ref 12.0–15.0)
Immature Granulocytes: 0 %
Lymphocytes Relative: 30 %
Lymphs Abs: 1.5 10*3/uL (ref 0.7–4.0)
MCH: 29.9 pg (ref 26.0–34.0)
MCHC: 33.9 g/dL (ref 30.0–36.0)
MCV: 88.3 fL (ref 80.0–100.0)
Monocytes Absolute: 0.4 10*3/uL (ref 0.1–1.0)
Monocytes Relative: 7 %
Neutro Abs: 2.4 10*3/uL (ref 1.7–7.7)
Neutrophils Relative %: 46 %
Platelet Count: 158 10*3/uL (ref 150–400)
RBC: 3.51 MIL/uL — ABNORMAL LOW (ref 3.87–5.11)
RDW: 13.3 % (ref 11.5–15.5)
WBC Count: 5.1 10*3/uL (ref 4.0–10.5)
nRBC: 0 % (ref 0.0–0.2)

## 2023-02-04 LAB — CMP (CANCER CENTER ONLY)
ALT: 13 U/L (ref 0–44)
AST: 14 U/L — ABNORMAL LOW (ref 15–41)
Albumin: 4.3 g/dL (ref 3.5–5.0)
Alkaline Phosphatase: 49 U/L (ref 38–126)
Anion gap: 7 (ref 5–15)
BUN: 48 mg/dL — ABNORMAL HIGH (ref 8–23)
CO2: 29 mmol/L (ref 22–32)
Calcium: 10.7 mg/dL — ABNORMAL HIGH (ref 8.9–10.3)
Chloride: 103 mmol/L (ref 98–111)
Creatinine: 1.4 mg/dL — ABNORMAL HIGH (ref 0.44–1.00)
GFR, Estimated: 39 mL/min — ABNORMAL LOW (ref >60)
Glucose, Bld: 202 mg/dL — ABNORMAL HIGH (ref 70–99)
Potassium: 3.7 mmol/L (ref 3.5–5.1)
Sodium: 139 mmol/L (ref 135–145)
Total Bilirubin: 0.5 mg/dL (ref <1.2)
Total Protein: 6.1 g/dL — ABNORMAL LOW (ref 6.5–8.1)

## 2023-02-04 LAB — IRON AND IRON BINDING CAPACITY (CC-WL,HP ONLY)
Iron: 48 ug/dL (ref 28–170)
Saturation Ratios: 15 % (ref 10.4–31.8)
TIBC: 323 ug/dL (ref 250–450)
UIBC: 275 ug/dL (ref 148–442)

## 2023-02-04 LAB — FOLATE: Folate: 10 ng/mL (ref >5.9)

## 2023-02-04 LAB — FERRITIN: Ferritin: 78 ng/mL (ref 11–307)

## 2023-02-04 LAB — VITAMIN B12: Vitamin B-12: 475 pg/mL (ref 180–914)

## 2023-02-04 NOTE — Telephone Encounter (Signed)
Left message to return call 

## 2023-02-04 NOTE — Progress Notes (Signed)
Cardiology Office Note:    Date:  02/04/2023  ID:  Kimberly Rivers, DOB [birth year], MRN [id number] PCP: Jarrett Soho, PA-C  [facility] Providers Cardiologist:  Nanetta Batty, MD       Patient Profile:      72 year old female with past medical history of hypertension, hyperlipidemia, aortic atherosclerosis, chronic kidney disease, anemia, CAD s/p DES x 2 mLAD in 2010 in [place], type 2 diabetes.   She has history of CAD s/p DES x 2 LAD in February 2010 in [place].  In 2019 Myoview was nonischemic.  Echocardiogram May 2022 with EF 60 to 70%, normal LV function, no RWMA, grade 1 DD, normal RV, no significant valve abnormalities.  Carotid ultrasound performed 12/2021 revealed 1-39% right ICA stenosis, 40-59% left ICA stenosis.  ABIs 04/2022 essentially normal.  She has noted ongoing dyspnea on exertion since approximately May 2024.  Was seen by Irving Burton, NP on 09/17/2018 for who ordered cardiac PET stress test and echocardiogram.  PET test completed on 10/28/2022, the study is normal and low risk with no evidence of ischemia.  Rest EF 65%, stress EF 75%.  CT portion with no acute extracardiac findings identified, did note aortic atherosclerosis. Echocardiogram 10/2022 showed EF 70-75%, hyperdynamic LV function, no RWMA, moderate asymmetric septal LVH, grade 1 DD, hyperdynamic RV, no significant valvular abnormalities.    Carotid ultrasound performed 11/26/2022 showing right ICA consistent with 1-39% stenosis, left ICA consistent with 40-59% stenosis.  Seen by Dr. Allyson Sabal, who referred to Dr. Izora Ribas for further evaluation of obstructive hypertrophic cardiomyopathy as this may cause some of the patient's symptoms.  This visit is scheduled and December 2024.       History of Present Illness:   Kimberly Rivers is a 72 y.o. female who returns for complaints of bradycardia.  Today, patient was seen by hematology office for anemia.  She was noted to be bradycardic with a heart rate in  the 40s.  EKG in office with sinus bradycardia with heart rate 48 bpm and RBBB.  She was told to call cardiology office for acute visit.   Today, patient notes she is doing well overall.  She notes ongoing dyspnea on exertion and fatigue now for several months but notes that she is only here because of her EKG today from her hematology office.  She notes that she has not had any new symptoms besides her ongoing DOE and fatigue.  She denies any vision changes, syncope, near syncope, dizziness, lightheadedness.  She has not had any chest pain, exertional angina, melena, hematochezia.  She has had an echocardiogram and cardiac PET stress with [place] since this complaint originated.  She does note seeing pulmonology clinic last week for same complaint.  Pulmonology did order PFTs and chest x-ray.  I reviewed her PFT today which showed minimal Obstructive Airways Disease - Emphysematous Type, it is unlikely that this is was causing her SOB.  She has been managing her anemia with iron and vitamin C supplements for several years.  Her recent blood work today with hematology showed hemoglobin level of 10.2 and hematocrit of 30, which is stable for her.  She has had a recent stool test that was negative for blood.        Review of Systems  Constitutional: Negative for weight gain and weight loss.  Cardiovascular:  Positive for dyspnea on exertion. Negative for chest pain, claudication, cyanosis, irregular heartbeat, leg swelling, near-syncope, orthopnea, palpitations, paroxysmal nocturnal dyspnea and syncope.  Respiratory:  Positive  for shortness of breath. Negative for cough and hemoptysis.   Gastrointestinal:  Negative for abdominal pain, hematochezia and melena.  Genitourinary:  Negative for hematuria.  Neurological:  Negative for dizziness, headaches, light-headedness and loss of balance.     See HPI    Studies Reviewed:       VAS US carotid on 11/26/2022 Right Carotid: Velocities in the right ICA are  consistent with a 1-39%  stenosis.   Left Carotid: Velocities in the left ICA are consistent with a 40-59%  stenosis.   Cardiac PET stress 10/28/2022   The study is normal. The study is low risk.   LV perfusion is normal. There is no evidence of ischemia. There is no evidence of infarction.   Rest left ventricular function is normal. Rest EF: 65%. Stress EF: 75%. End diastolic cavity size is normal. End systolic cavity size is normal.   Myocardial blood flow was computed to be 0.61ml/g/min at rest and 2.71ml/g/min at stress. Global myocardial blood flow reserve was 2.75 and was normal.   Coronary calcium assessment not performed due to prior revascularization.  Echocardiogram 11/07/2022  1. Left ventricular ejection fraction, by estimation, is 70 to 75%. Left  ventricular ejection fraction by 3D volume is 73 %. The left ventricle has  hyperdynamic function. The left ventricle has no regional wall motion  abnormalities. There is moderate  asymmetric left ventricular hypertrophy of the basal-septal segment. Left  ventricular diastolic parameters are consistent with Grade I diastolic  dysfunction (impaired relaxation).   2. Right ventricular systolic function is hyperdynamic. The right  ventricular size is normal.   3. Left atrial size was moderately dilated.   4. The mitral valve is normal in structure. No evidence of mitral valve  regurgitation. No evidence of mitral stenosis.   5. The aortic valve is tricuspid. Aortic valve regurgitation is not  visualized. No aortic stenosis is present.   6. The inferior vena cava is normal in size with greater than 50%  respiratory variability, suggesting right atrial pressure of 3 mmHg.   Risk Assessment/Calculations:             Physical Exam:   VS:  BP (!) 130/44 (BP Location: Left Arm, Patient Position: Sitting, Cuff Size: Normal)   Pulse (!) 56   Ht 4\' 11"  (1.499 m)   Wt 123 lb (55.8 kg)   BMI 24.84 kg/m    Wt Readings from Last 3  Encounters:  02/04/23 123 lb (55.8 kg)  02/04/23 121 lb 14.4 oz (55.3 kg)  01/27/23 115 lb 9.6 oz (52.4 kg)    Constitutional:      Appearance: Normal and healthy appearance.  HENT:     Head: Normocephalic.  Neck:     Vascular: JVD normal.  Pulmonary:     Effort: Pulmonary effort is normal.     Breath sounds: Normal breath sounds.  Chest:     Chest wall: Not tender to palpatation.  Cardiovascular:     PMI at left midclavicular line. Normal rate. Regular rhythm. Normal S1. Normal S2.      Murmurs: There is no murmur.     No gallop.  No click. No rub.  Pulses:    Intact distal pulses.  Edema:    Peripheral edema absent.  Musculoskeletal: Normal range of motion.     Cervical back: Normal range of motion and neck supple. Skin:    General: Skin is warm and dry.  Neurological:     General: No focal deficit  present.     Mental Status: Alert, oriented to person, place, and time and oriented to person, place and time.  Psychiatric:        Attention and Perception: Attention and perception normal.        Mood and Affect: Mood normal.        Behavior: Behavior is cooperative.        Thought Content: Thought content normal.        Assessment and Plan:  Bradycardia  -Patient is asymptomatic. She notes ongoing chronic DOE and fatigue -PFTs with pulmonology showed minimal obstructive airways disease - emphysematous type -Denies lightheadedness, blurry vision, syncope, near syncope, chest pain, dizziness, headaches. -EKG sinus bradycardia with RBBB heart rate 46 bpm at hematology office -She has history of sinus bradycardia with heart rate normally in the 50s.  -Heart rate today in office was 56 bpm.  I walked her around the office with pulse oximeter and her heart rate increased up to 75 so she is without chronotropic incompetence.  Her SpO2 levels were 95% during the walk. -I will order 3-day heart monitor to further assess -She is not on any AV nodal blocking agents  Coronary  artery disease -s/p DES x 2 mLAD in 2010  -Cardiac PET stress on 10/28/2022 low risk study and no ischemia identified -She denies any exertional angina, no ischemic evaluation needed at this time -Continue aspirin 81 mg daily  Obstructive hypertrophic cardiomyopathy  -Echocardiogram performed 10/2022 showed moderate asymmetric septal hypertrophy with a small outflow tract gradient -Unclear whether shortness of breath is contributory -Dr. Allyson Sabal had previously referred to Dr. Izora Ribas for further evaluation with appointment set up for December 2024  Carotid artery disease -Carotid ultrasound performed 11/26/2022 showing right ICA consistent with 1-39% stenosis, left ICA consistent with 40-59% stenosis. -Mild progression in right ICA stenosis.  Repeat in 12 months  Hyperlipidemia -LDL 60 on 03/05/2022 -Stable continue atorvastatin 80 mg once daily  Hypertension -BP today 130/44, well-controlled continue enalapril 5 mg, nifedipine 60 mg  CKD Stage IIIb -Follows with nephrology                  Dispo:  Return in about 6 months (around 08/04/2023).  Signed, Denyce Robert, NP

## 2023-02-04 NOTE — Progress Notes (Unsigned)
Enrolled for [employer] to mail a ZIO XT long term holter monitor to the patients address on file.   Dr. Berry to read. 

## 2023-02-04 NOTE — Telephone Encounter (Signed)
Vikki Ports notified of pts heart rate.

## 2023-02-04 NOTE — Progress Notes (Signed)
[facility] [phone]   [phone]) [phone]  CONSULT NOTE  REFERRING PHYSICIAN: Jarrett Soho, PA-C  REASON FOR CONSULTATION:  72 years old white female with persistent anemia  HPI Kimberly Rivers is a 72 y.o. female.   Discussed the use of AI scribe software for clinical note transcription with the patient, who gave verbal consent to proceed.  History of Present Illness   The patient, a 72 year old with a history of anemia, chronic kidney disease, and hypertension, presents for further evaluation of her anemia. She has been aware of her anemia for several years and has been managing it with iron and vitamin C supplements. The patient has not seen a hematologist for approximately seven years, as her anemia seemed to be under control with her current regimen.  The patient's recent blood work showed a hemoglobin level of 10.2 and hematocrit of 30.0. A subsequent stool test was negative for blood, suggesting no active bleeding. The patient's last colonoscopy was in the summer of 2019 and was reportedly normal.  The patient reports feeling tired and experiencing shortness of breath, which she attributes to her low hemoglobin levels. She also mentions that a recent scan showed an enlarged left ventricle pressing into the lung, for which she is due to see a cardiologist. She denies experiencing any dizzy spells.  The patient has a history of angioplasty with two stents and is currently on medication to lower her heart rate. She denies any bleeding, unintentional weight loss, nausea, vomiting, or diarrhea, but reports constipation. She has no known drug allergies except for penicillin, clindamycin, and doxycycline. The patient is a non-smoker and does not consume alcohol regularly. She has no family history of severe anemia or cancer.         Past Medical History:  Diagnosis Date   Cataract    OD   Chronic kidney disease    Diabetes mellitus without  complication (HCC)    Hypertension    Hypertensive retinopathy    OU   Macular degeneration    Dry OU    Past Surgical History:  Procedure Laterality Date   ANGIOPLASTY     CATARACT EXTRACTION Left    EYE SURGERY Left    Cat Sx   SPINAL FUSION     WRIST SURGERY      Family History  Problem Relation Age of Onset   Hypertension Mother    Heart disease Mother    Diabetes Mother    Canavan disease Father    Heart disease Maternal Grandmother    Heart disease Maternal Grandfather    Kidney failure Maternal Grandfather     Social History Social History   Tobacco Use   Smoking status: Never   Smokeless tobacco: Never  Vaping Use   Vaping status: Never Used  Substance Use Topics   Alcohol use: Not Currently   Drug use: Never    Allergies  Allergen Reactions   Clindamycin/Lincomycin    Doxycycline    Penicillins     Current Outpatient Medications  Medication Sig Dispense Refill   aspirin EC 81 MG tablet Take 81 mg by mouth daily.     atorvastatin (LIPITOR) 80 MG tablet Take 1 tablet by mouth once daily 90 tablet 3   Carboxymethylcellulose Sodium (EYE DROPS OP) Apply to eye. Refresh Drops as needed before bed     Carboxymethylcellulose Sodium (THERATEARS OP) Apply to eye in the morning.     Cholecalciferol (VITAMIN D-3 PO) Take 1 Capful  by mouth daily.     citalopram (CELEXA) 20 MG tablet Take 20 mg by mouth daily.     CRANBERRY PO Take 4,200 capsules by mouth daily.     enalapril (VASOTEC) 5 MG tablet Take 1 tablet (5 mg total) by mouth daily. 90 tablet 3   Ferrous Sulfate (IRON) 325 (65 Fe) MG TABS Take 1 tablet by mouth daily.     gabapentin (NEURONTIN) 300 MG capsule Take 300 mg by mouth at bedtime.      Lactobacillus (PROBIOTIC ACIDOPHILUS PO) Take by mouth.     NIFEdipine (PROCARDIA XL/NIFEDICAL XL) 60 MG 24 hr tablet Take 1 tablet (60 mg total) by mouth daily. 90 tablet 3   oxybutynin (DITROPAN-XL) 5 MG 24 hr tablet Take 5 mg by mouth at bedtime.      repaglinide (PRANDIN) 0.5 MG tablet      vitamin C (ASCORBIC ACID) 500 MG tablet Take 500 mg by mouth daily.     No current facility-administered medications for this visit.    Review of Systems  Constitutional: positive for fatigue Eyes: negative Ears, nose, mouth, throat, and face: negative Respiratory: negative Cardiovascular: negative Gastrointestinal: negative Genitourinary:negative Integument/breast: negative Hematologic/lymphatic: negative Musculoskeletal:negative Neurological: negative Behavioral/Psych: negative Endocrine: negative Allergic/Immunologic: negative  Physical Exam  WGN:FAOZH, healthy, no distress, well nourished, and well developed SKIN: skin color, texture, turgor are normal, no rashes or significant lesions HEAD: Normocephalic, No masses, lesions, tenderness or abnormalities EYES: normal, PERRLA, Conjunctiva are pink and non-injected EARS: External ears normal, Canals clear OROPHARYNX:no exudate, no erythema, and lips, buccal mucosa, and tongue normal  NECK: supple, no adenopathy, no JVD LYMPH:  no palpable lymphadenopathy, no hepatosplenomegaly BREAST:not examined LUNGS: clear to auscultation , and palpation HEART: regular rate & rhythm, no murmurs, and no gallops ABDOMEN:abdomen soft, non-tender, normal bowel sounds, and no masses or organomegaly BACK: Back symmetric, no curvature., No CVA tenderness EXTREMITIES:no joint deformities, effusion, or inflammation, no edema  NEURO: alert & oriented x 3 with fluent speech, no focal motor/sensory deficits  PERFORMANCE STATUS: ECOG 1  LABORATORY DATA: Lab Results  Component Value Date   WBC 5.1 02/04/2023   HGB 10.5 (L) 02/04/2023   HCT 31.0 (L) 02/04/2023   MCV 88.3 02/04/2023   PLT 158 02/04/2023      Chemistry      Component Value Date/Time   NA 139 02/04/2023 1114   K 3.7 02/04/2023 1114   CL 103 02/04/2023 1114   CO2 29 02/04/2023 1114   BUN 48 (H) 02/04/2023 1114   CREATININE 1.40  (H) 02/04/2023 1114      Component Value Date/Time   CALCIUM 10.7 (H) 02/04/2023 1114   ALKPHOS 49 02/04/2023 1114   AST 14 (L) 02/04/2023 1114   ALT 13 02/04/2023 1114   BILITOT 0.5 02/04/2023 1114       RADIOGRAPHIC STUDIES: No results found.  ASSESSMENT AND PLAN:    Anemia of Chronic Disease Chronic anemia likely secondary to chronic kidney disease. Hemoglobin levels are 10.2-10.8 g/dL. Iron studies are normal. Negative hemoccult tests. No recent weight loss, nausea, vomiting, or diarrhea. Reports fatigue and shortness of breath, likely related to low hemoglobin. Current hemoglobin levels are acceptable. - Continue iron supplements (Ferrous sulfate 65 mg) and vitamin C - Monitor hemoglobin and hematocrit levels periodically - Follow up with family doctor for routine management  Chronic Kidney Disease Managed by Dr. Ernestina Columbia. Serum creatinine is 1.4 mg/dL, contributing to anemia. - Continue management with nephrologist Dr. Ernestina Columbia -  Monitor kidney function regularly  Diabetes Mellitus Elevated blood sugar level at 202 mg/dL, likely non-fasting. Known diabetes. - Monitor blood sugar levels - Continue diabetes management as per primary care provider  Hypertension Hypertension with hypertensive retinopathy. On Procardia XL, which may contribute to low heart rate. - Continue current antihypertensive regimen - Monitor blood pressure and heart rate regularly  General Health Maintenance Up to date with colonoscopy (2019, normal results). No smoking, alcohol, or drug abuse. Allergic to penicillin, clindamycin, and doxycycline. - Continue routine health maintenance and screenings as per primary care provider  Follow-up - Check pulse before departure - Provide contact information for further questions or concerns.   The patient was advised to call immediately if she has any concerning symptoms in the interval. The patient voices understanding of current disease status  and treatment options and is in agreement with the current care plan.  All questions were answered. The patient knows to call the clinic with any problems, questions or concerns. We can certainly see the patient much sooner if necessary.  Thank you so much for allowing me to participate in the care of Kimberly Rivers. I will continue to follow up the patient with you and assist in her care. The total time spent in the appointment was 60 minutes.  Disclaimer: This note was dictated with voice recognition software. Similar sounding words can inadvertently be transcribed and may not be corrected upon review.   Lajuana Matte February 04, 2023, 12:05 PM

## 2023-02-04 NOTE — Progress Notes (Addendum)
Abnormal heart rhythm EKG - reported to Dr Hazle Coca nurse , Vikki Ports. No other symptoms reported except mild SOB.  Per Vikki Ports , I instructed pt to hold Enalapril today until she talks to Dr Allyson Sabal or his nurse. I told pt to call Dr. Hazle Coca office when she gets home.  She is going grocery shopping and will call his office when she gets home.

## 2023-02-04 NOTE — Patient Instructions (Signed)
Medication Instructions:  NO CHANGES *If you need a refill on your cardiac medications before your next appointment, please call your pharmacy*   Lab Work: NO LABS If you have labs (blood work) drawn today and your tests are completely normal, you will receive your results only by: MyChart Message (if you have MyChart) OR A paper copy in the mail If you have any lab test that is abnormal or we need to change your treatment, we will call you to review the results.   Testing/Procedures:  Kimberly Rivers- Long Term Monitor Instructions  Your physician has requested you wear a ZIO patch monitor for 3 days.  This is a single patch monitor. [employer] supplies one patch monitor per enrollment. Additional stickers are not available. Please do not apply patch if you will be having a Nuclear Stress Test,  Echocardiogram, Cardiac CT, MRI, or Chest Xray during the period you would be wearing the  monitor. The patch cannot be worn during these tests. You cannot remove and re-apply the  ZIO XT patch monitor.  Your ZIO patch monitor will be mailed 3 day USPS to your address on file. It may take 3-5 days  to receive your monitor after you have been enrolled.  Once you have received your monitor, please review the enclosed instructions. Your monitor  has already been registered assigning a specific monitor serial # to you.  Billing and Patient Assistance Program Information  We have supplied [employer] with any of your insurance information on file for billing purposes. [employer] offers a sliding scale Patient Assistance Program for patients that do not have  insurance, or whose insurance does not completely cover the cost of the ZIO monitor.  You must apply for the Patient Assistance Program to qualify for this discounted rate.  To apply, please call [employer] at [phone], select option 4, select option 2, ask to apply for  Patient Assistance Program. Kimberly Rivers will ask your household income, and how many people   are in your household. They will quote your out-of-pocket cost based on that information.  [employer] will also be able to set up a 85-month, interest-free payment plan if needed.  Applying the monitor   Shave hair from upper left chest.  Hold abrader disc by orange tab. Rub abrader in 40 strokes over the upper left chest as  indicated in your monitor instructions.  Clean area with 4 enclosed alcohol pads. Let dry.  Apply patch as indicated in monitor instructions. Patch will be placed under collarbone on left  side of chest with arrow pointing upward.  Rub patch adhesive wings for 2 minutes. Remove white label marked "1". Remove the white  label marked "2". Rub patch adhesive wings for 2 additional minutes.  While looking in a mirror, press and release button in center of patch. A small green light will  flash 3-4 times. This will be your only indicator that the monitor has been turned on.  Do not shower for the first 24 hours. You may shower after the first 24 hours.  Press the button if you feel a symptom. You will hear a small click. Record Date, Time and  Symptom in the Patient Logbook.  When you are ready to remove the patch, follow instructions on the last 2 pages of Patient  Logbook. Stick patch monitor onto the last page of Patient Logbook.  Place Patient Logbook in the blue and white box. Use locking tab on box and tape box closed  securely. The blue and white  box has prepaid postage on it. Please place it in the mailbox as  soon as possible. Your physician should have your test results approximately 7 days after the  monitor has been mailed back to [facility].  Call [facility] Customer Care at [phone] if you have questions regarding  your ZIO XT patch monitor. Call them immediately if you see an orange light blinking on your  monitor.  If your monitor falls off in less than 4 days, contact our Monitor department at [phone].  If your monitor becomes loose or  falls off after 4 days call [employer] at [phone] for  suggestions on securing your monitor    Follow-Up: At [facility], you and your health needs are our priority.  As part of our continuing mission to provide you with exceptional heart care, we have created designated Provider Care Teams.  These Care Teams include your primary Cardiologist (physician) and Advanced Practice Providers (APPs -  Physician Assistants and Nurse Practitioners) who all work together to provide you with the care you need, when you need it.   Your next appointment:   6 month(s)  Provider:   Nanetta Batty, MD

## 2023-02-04 NOTE — Telephone Encounter (Signed)
Received call from Diane at [facility] office concerning patient in the office and her HR is 46-48. She report patient c/o SOB but her O2 sat was 100% on RA. Patient deny any other symptoms at that time. Patient states she took her Nifedipine 60 mg this morning.  Patient is on the way back home from her appointment and will reach back out to the office once she gets home. Please advise.

## 2023-02-04 NOTE — Telephone Encounter (Signed)
STAT if HR is under 50 or over 120 (normal HR is 60-100 beats per minute)  What is your heart rate? 48  Do you have a log of your heart rate readings (document readings)? 58, 48, 46   Do you have any other symptoms? Shortness of breathe

## 2023-02-06 NOTE — Telephone Encounter (Signed)
Patient has been seen.

## 2023-02-07 DIAGNOSIS — R001 Bradycardia, unspecified: Secondary | ICD-10-CM | POA: Diagnosis not present

## 2023-02-08 LAB — PROTEIN ELECTROPHORESIS, SERUM, WITH REFLEX
A/G Ratio: 2.6 — ABNORMAL HIGH (ref 0.7–1.7)
Albumin ELP: 4.1 g/dL (ref 2.9–4.4)
Alpha-1-Globulin: 0.2 g/dL (ref 0.0–0.4)
Alpha-2-Globulin: 0.5 g/dL (ref 0.4–1.0)
Beta Globulin: 0.7 g/dL (ref 0.7–1.3)
Gamma Globulin: 0.2 g/dL — ABNORMAL LOW (ref 0.4–1.8)
Globulin, Total: 1.6 g/dL — ABNORMAL LOW (ref 2.2–3.9)
Total Protein ELP: 5.7 g/dL — ABNORMAL LOW (ref 6.0–8.5)

## 2023-02-15 DIAGNOSIS — Z03818 Encounter for observation for suspected exposure to other biological agents ruled out: Secondary | ICD-10-CM | POA: Diagnosis not present

## 2023-02-15 DIAGNOSIS — R051 Acute cough: Secondary | ICD-10-CM | POA: Diagnosis not present

## 2023-02-19 DIAGNOSIS — I10 Essential (primary) hypertension: Secondary | ICD-10-CM | POA: Diagnosis not present

## 2023-02-19 DIAGNOSIS — Z6825 Body mass index (BMI) 25.0-25.9, adult: Secondary | ICD-10-CM | POA: Diagnosis not present

## 2023-02-19 DIAGNOSIS — L853 Xerosis cutis: Secondary | ICD-10-CM | POA: Diagnosis not present

## 2023-02-19 DIAGNOSIS — R001 Bradycardia, unspecified: Secondary | ICD-10-CM | POA: Diagnosis not present

## 2023-02-25 ENCOUNTER — Other Ambulatory Visit: Payer: Self-pay | Admitting: Cardiovascular Disease

## 2023-03-03 ENCOUNTER — Ambulatory Visit: Payer: PPO | Admitting: Genetic Counselor

## 2023-03-03 ENCOUNTER — Encounter: Payer: Self-pay | Admitting: Internal Medicine

## 2023-03-03 ENCOUNTER — Ambulatory Visit: Payer: PPO | Attending: Internal Medicine | Admitting: Internal Medicine

## 2023-03-03 VITALS — BP 110/50 | HR 54 | Ht 59.0 in | Wt 123.2 lb

## 2023-03-03 DIAGNOSIS — N1831 Chronic kidney disease, stage 3a: Secondary | ICD-10-CM

## 2023-03-03 DIAGNOSIS — D638 Anemia in other chronic diseases classified elsewhere: Secondary | ICD-10-CM

## 2023-03-03 DIAGNOSIS — I517 Cardiomegaly: Secondary | ICD-10-CM | POA: Insufficient documentation

## 2023-03-03 NOTE — Progress Notes (Signed)
Cardiology Office Note:  .    Date:  03/03/2023  ID:  Kimberly Rivers, DOB 12-30-1950, MRN [id number] PCP: Jarrett Soho, PA-C  [facility] Providers Cardiologist:  Nanetta Batty, MD     CC: Echo f/u  Consulted for the evaluation of LVH at the behest of Dr. Allyson Sabal   History of Present Illness: .    Kimberly Rivers is a 72 y.o. female HTN, HLD and CAD.  Found to have moderate LVH with BSH on last echocardiogram.  Has CAD and carotid disease with intracavitary gradient related to dynamic function.   Kimberly Rivers, with a history of coronary disease, carotid disease, and moderate asymptomatic septal hypertrophy, presents with worsening shortness of breath over the past five to six months. She reports difficulty walking for more than thirty seconds without needing to stop to catch her breath. This has significantly impacted her daily activities, as she has to stop five or six times during a ten-minute walk to the local [employer].  The patient also reports chronic fatigue, which she attributes to her long-standing anemia. The cause of the anemia remains unknown despite being present for several years.  In addition to these symptoms, the patient has a history of chronic kidney disease, with a fluctuating creatinine level between 43 and 46. She also suffers from incontinence, anxiety, depression, and constipation. She has arthritis in her knee and hip, which is managed with daily physical therapy exercises. Despite these exercises being non-strenuous, they leave the patient short of breath.  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 She has a family history of heart disease, with her mother having undergone a heart bypass and her brother currently attending cardiac rehabilitation.  No family history of HCM  Relevant histories: .  Social- originally from [place] ROS: As per HPI.   Studies Reviewed: .    Cardiac Studies & Procedures     STRESS TESTS  NM PET CT CARDIAC PERFUSION MULTI W/ABSOLUTE BLOODFLOW 10/28/2022  Narrative   The study is normal. The study is low risk.   LV perfusion is normal. There is no evidence of ischemia. There is no evidence of infarction.   Rest left ventricular function is normal. Rest EF: 65%. Stress EF: 75%. End diastolic cavity size is normal. End systolic cavity size is normal.   Myocardial blood flow was computed to be 0.38ml/g/min at rest and 2.64ml/g/min at stress. Global myocardial blood flow reserve was 2.75 and was normal.   Coronary calcium assessment not performed due to prior revascularization.   Electronically Signed: Thurmon Fair, MD  EXAM: OVER-READ INTERPRETATION  CT CHEST  The following report is a limited chest CT over-read performed by radiologist Dr. Irma [facility] on 10/28/2022. This over-read does not include interpretation of cardiac or coronary anatomy or pathology nor does it include evaluation of the PET data. The cardiac PET-CT interpretation by the cardiologist is attached.  COMPARISON:  None.  FINDINGS: Mediastinum/Nodes: No enlarged lymph nodes within the visualized mediastinum.Aortic atherosclerosis with possible calcifications of the aortic valve.  Lungs/Pleura: No pleural effusion or basilar pneumothorax. Tiny calcified left lower lobe granuloma and mild linear atelectasis or scarring at the left lung base. The visualized lung bases are otherwise clear.  Upper abdomen: The spleen is incompletely visualized, although appears prominent, measuring up to 12.9 cm in length. Probable cyst peripherally in the right hepatic lobe.  Musculoskeletal/Chest wall: No chest wall mass or suspicious  osseous findings within the visualized chest. Thoracolumbar scoliosis with evidence of previous multilevel posterior thoracic surgical fusion.  IMPRESSION: 1. No acute extra cardiac findings  identified. 2. The spleen is incompletely visualized, although appears prominent. 3. Thoracolumbar scoliosis with evidence of previous multilevel posterior thoracic surgical fusion. 4.  Aortic Atherosclerosis (ICD10-I70.0).   Electronically Signed By: Carey Bullocks M.D. On: 10/28/2022 13:37  ECHOCARDIOGRAM  ECHOCARDIOGRAM COMPLETE 11/07/2022  Narrative ECHOCARDIOGRAM REPORT    [name] Date of Exam: 11/07/2022 Medical Rec #:  [id number]          Height:       59.0 in Accession #:    [id number]         Weight:       127.2 lb Date of Birth:  [birth year]         BSA:          1.522 m Patient Age:    71 years           BP:           132/52 mmHg Patient Gender: F                  HR:           48 bpm. Exam Location:  Outpatient  Procedure: 2D Echo, 3D Echo, Color Doppler and Cardiac Doppler  Indications:    R06.9 DOE  History:        Patient has prior history of Echocardiogram examinations, most recent 01/18/2021. CAD, Signs/Symptoms:Dyspnea; Risk Factors:Hypertension, Dyslipidemia and Non-Smoker. Patient denies chest pain and leg edema. She does have DOE.  Sonographer:    Carlos American RVT, RDCS (AE), RDMS Referring Phys: [id number] JONATHAN J BERRY  IMPRESSIONS   1. Left ventricular ejection fraction, by estimation, is 70 to 75%. Left ventricular ejection fraction by 3D volume is 73 %. The left ventricle has hyperdynamic function. The left ventricle has no regional wall motion abnormalities. There is moderate asymmetric left ventricular hypertrophy of the basal-septal segment. Left ventricular diastolic parameters are consistent with Grade I diastolic dysfunction (impaired relaxation). 2. Right ventricular systolic function is hyperdynamic. The right ventricular size is normal. 3. Left atrial size was moderately dilated. 4. The mitral valve is normal in structure. No evidence of mitral valve regurgitation. No evidence of mitral stenosis. 5. The aortic  valve is tricuspid. Aortic valve regurgitation is not visualized. No aortic stenosis is present. 6. The inferior vena cava is normal in size with greater than 50% respiratory variability, suggesting right atrial pressure of 3 mmHg.  Comparison(s): EF 65-70%, intraventricular gradient. Similar function and gradients.  FINDINGS Left Ventricle: There is a small < 30 mm HG intracavitary gradient with Valsalva. Left ventricular ejection fraction, by estimation, is 70 to 75%. Left ventricular ejection fraction by 3D volume is 73 %. The left ventricle has hyperdynamic function. The left ventricle has no regional wall motion abnormalities. The left ventricular internal cavity size was normal in size. There is moderate asymmetric left ventricular hypertrophy of the basal-septal segment. Left ventricular diastolic parameters are consistent with Grade I diastolic dysfunction (impaired relaxation).  Right Ventricle: The right ventricular size is normal. No increase in right ventricular wall thickness. Right ventricular systolic function is hyperdynamic.  Left Atrium: Left atrial size was moderately dilated.  Right Atrium: Right atrial size was normal in size.  Pericardium: Trivial pericardial effusion is present. The pericardial effusion is lateral to the left ventricle.  Mitral Valve: The mitral valve  is normal in structure. No evidence of mitral valve regurgitation. No evidence of mitral valve stenosis.  Tricuspid Valve: The tricuspid valve is normal in structure. Tricuspid valve regurgitation is trivial. No evidence of tricuspid stenosis.  Aortic Valve: The aortic valve is tricuspid. Aortic valve regurgitation is not visualized. No aortic stenosis is present. Aortic valve mean gradient measures 6.5 mmHg. Aortic valve peak gradient measures 12.0 mmHg. Aortic valve area, by VTI measures 1.66 cm.  Pulmonic Valve: The pulmonic valve was normal in structure. Pulmonic valve regurgitation is mild. No  evidence of pulmonic stenosis.  Aorta: The aortic root and ascending aorta are structurally normal, with no evidence of dilitation.  Venous: The inferior vena cava is normal in size with greater than 50% respiratory variability, suggesting right atrial pressure of 3 mmHg.  IAS/Shunts: No atrial level shunt detected by color flow Doppler.   LEFT VENTRICLE PLAX 2D LVIDd:         3.82 cm         Diastology LVIDs:         1.47 cm         LV e' medial:    5.98 cm/s LV PW:         1.03 cm         LV E/e' medial:  15.2 LV IVS:        1.10 cm         LV e' lateral:   6.85 cm/s LVOT diam:     1.60 cm         LV E/e' lateral: 13.2 LV SV:         65 LV SV Index:   43 LVOT Area:     2.01 cm        3D Volume EF LV 3D EF:    Left ventricul ar ejection fraction by 3D volume is 73 %.  3D Volume EF: 3D EF:        73 % LV EDV:       101 ml LV ESV:       28 ml LV SV:        73 ml  RIGHT VENTRICLE RV S prime:     13.30 cm/s TAPSE (M-mode): 2.6 cm  LEFT ATRIUM             Index        RIGHT ATRIUM           Index LA diam:        3.30 cm 2.17 cm/m   RA Area:     16.00 cm LA Vol (A2C):   84.7 ml 55.66 ml/m  RA Volume:   44.10 ml  28.98 ml/m LA Vol (A4C):   44.4 ml 29.18 ml/m LA Biplane Vol: 62.4 ml 41.01 ml/m AORTIC VALVE                     PULMONIC VALVE AV Area (Vmax):    1.70 cm      PV Vmax:          1.31 m/s AV Area (Vmean):   1.61 cm      PV Peak grad:     6.8 mmHg AV Area (VTI):     1.66 cm      PR End Diast Vel: 2.24 msec AV Vmax:           173.50 cm/s AV Vmean:          118.500 cm/s AV VTI:  0.392 m AV Peak Grad:      12.0 mmHg AV Mean Grad:      6.5 mmHg LVOT Vmax:         147.00 cm/s LVOT Vmean:        94.800 cm/s LVOT VTI:          0.323 m LVOT/AV VTI ratio: 0.82  AORTA Ao Root diam: 2.70 cm Ao Asc diam:  2.80 cm Ao Arch diam: 2.6 cm  MITRAL VALVE                TRICUSPID VALVE MV Area (PHT): 2.62 cm     TR Peak grad:   31.8 mmHg MV Decel  Time: 289 msec     TR Vmax:        282.00 cm/s MV E velocity: 90.70 cm/s MV A velocity: 120.00 cm/s  SHUNTS MV E/A ratio:  0.76         Systemic VTI:  0.32 m Systemic Diam: 1.60 cm  Riley Lam MD Electronically signed by Riley Lam MD Signature Date/Time: 11/07/2022/2:51:25 PM    Final   MONITORS  LONG TERM MONITOR (3-14 DAYS) 02/19/2023  Narrative Patch Wear Time:  3 days and 0 hours (2024-11-23T11:28:36-0500 to 2024-11-26T12:10:21-0500)  Patient had a min HR of 37 bpm, max HR of 133 bpm, and avg HR of 53 bpm. Predominant underlying rhythm was Sinus Rhythm. Bundle Branch Block/IVCD was present. 1 run of Supraventricular Tachycardia occurred lasting 5 beats with a max rate of 133 bpm (avg 108 bpm). Isolated SVEs were frequent (7.5%, 14869), and no SVE Couplets or SVE Triplets were present. Isolated VEs were rare (<1.0%), and no VE Couplets or VE Triplets were present.  SR/SB/ST Rare PVCs/ Occasional PACs9 7.5% burden) One short run of SVT            Risk Assessment/Calculations:         Physical Exam:    VS:  BP (!) 110/50 (BP Location: Right Arm, Patient Position: Sitting, Cuff Size: Normal)   Pulse (!) 54   Ht 4\' 11"  (1.499 m)   Wt 123 lb 3.2 oz (55.9 kg)   SpO2 98%   BMI 24.88 kg/m    Wt Readings from Last 3 Encounters:  03/03/23 123 lb 3.2 oz (55.9 kg)  02/04/23 123 lb (55.8 kg)  02/04/23 121 lb 14.4 oz (55.3 kg)    Gen: no distress  Neck: No JVD Cardiac: No Rubs or Gallops, Harsh Systolic Murmur, regular bradycardia, +2 radial pulses Respiratory: Clear to auscultation bilaterally, normal effort, normal  respiratory rate GI: Soft, nontender, non-distended  MS: No  edema;  moves all extremities Integument: Skin feels warm Neuro:  At time of evaluation, alert and oriented to person/place/time/situation  Psych: Normal affect, patient feels well   ASSESSMENT AND PLAN: .    Shortness of Breath and LVH - Significant shortness of breath  on exertion, worsening over 5-6 months. Differential includes hypertrophic cardiomyopathy, cardiac amyloidosis, or other non-cardiac conditions. Echocardiograms in 2022 and 2024 showed hyperdynamic heart with mild-moderate thickening. - Order cardiac MRI - Review MRI results and follow up accordingly  Anemia Chronic anemia of unknown etiology, contributing to fatigue and shortness of breath.  GI issues, Incontinence, CKD, and poly arthralgias - this can be seen with amyloid; will get CMR as above  Constipation Chronic constipation. - Continue current management  If HCM or amyloid will bring back and start therapy If not, can return to primary caridologist   Riley Lam, MD  [facility] Cardiologist [facility]  [address], [address] [place], [place] [address] [phone]  2:20 PM

## 2023-03-03 NOTE — Patient Instructions (Signed)
Medication Instructions:  Your physician recommends that you continue on your current medications as directed. Please refer to the Current Medication list given to you today.  *If you need a refill on your cardiac medications before your next appointment, please call your pharmacy*   Lab Work: CBC  If you have labs (blood work) drawn today and your tests are completely normal, you will receive your results only by: MyChart Message (if you have MyChart) OR A paper copy in the mail If you have any lab test that is abnormal or we need to change your treatment, we will call you to review the results.   Testing/Procedures: Your physician has requested that you have a cardiac MRI. Cardiac MRI uses a computer to create images of your heart as its beating, producing both still and moving pictures of your heart and major blood vessels. For further information please visit [email or web address]. Please follow the instruction sheet given to you today for more information.    Follow-Up:To be determined, based on results of Cardiac MRI At [facility], you and your health needs are our priority.  As part of our continuing mission to provide you with exceptional heart care, we have created designated Provider Care Teams.  These Care Teams include your primary Cardiologist (physician) and Advanced Practice Providers (APPs -  Physician Assistants and Nurse Practitioners) who all work together to provide you with the care you need, when you need it.    Provider:   Riley Lam, MD  Other Instructions   You are scheduled for Cardiac MRI at the location below.  Please arrive for your appointment at ______________ . ?  [facility] [address] [place], [place] [address] [phone] Please take advantage of the free valet parking available at the [facility] and [employer] (Entrance C).  Proceed to the [facility] Radiology Department (First Floor) for check-in.    OR   [facility] [address] [place], [place] [address] [phone] Please go to the [facility] and check-in with the desk attendant.   Magnetic resonance imaging (MRI) is a painless test that produces images of the inside of the body without using Xrays.  During an MRI, strong magnets and radio waves work together in a Data processing manager to form detailed images.   MRI images may provide more details about a medical condition than X-rays, CT scans, and ultrasounds can provide.  You may be given earphones to listen for instructions.  You may eat a light breakfast and take medications as ordered with the exception of furosemide, hydrochlorothiazide, chlorthalidone or spironolactone (or any other fluid pill). If you are undergoing a stress MRI, please avoid stimulants for 12 hr prior to test. (I.e. Caffeine, nicotine, chocolate, or antihistamine medications)  An IV will be inserted into one of your veins. Contrast material will be injected into your IV. It will leave your body through your urine within a day. You may be told to drink plenty of fluids to help flush the contrast material out of your system.  You will be asked to remove all metal, including: Watch, jewelry, and other metal objects including hearing aids, hair pieces and dentures. Also wearable glucose monitoring systems (ie. Freestyle Libre and Omnipods) (Braces and fillings normally are not a problem.)   TEST WILL TAKE APPROXIMATELY 1 HOUR  PLEASE NOTIFY SCHEDULING AT LEAST 24 HOURS IN ADVANCE IF YOU ARE UNABLE TO KEEP YOUR APPOINTMENT. [phone]  For more information and frequently asked  questions, please visit our website : [email or web address]  Please call the Cardiac Imaging Nurse Navigators with any questions/concerns. [phone] Office

## 2023-03-05 ENCOUNTER — Ambulatory Visit
Admission: RE | Admit: 2023-03-05 | Discharge: 2023-03-05 | Disposition: A | Discharge status: Home / Self Care | Payer: PPO | Source: Ambulatory Visit

## 2023-03-05 DIAGNOSIS — Z1231 Encounter for screening mammogram for malignant neoplasm of breast: Secondary | ICD-10-CM | POA: Diagnosis not present

## 2023-03-17 DIAGNOSIS — I517 Cardiomegaly: Secondary | ICD-10-CM | POA: Diagnosis not present

## 2023-03-18 LAB — CBC
Hematocrit: 30.8 % — ABNORMAL LOW (ref 34.0–46.6)
Hemoglobin: 10.1 g/dL — ABNORMAL LOW (ref 11.1–15.9)
MCH: 29.7 pg (ref 26.6–33.0)
MCHC: 32.8 g/dL (ref 31.5–35.7)
MCV: 91 fL (ref 79–97)
Platelets: 160 10*3/uL (ref 150–450)
RBC: 3.4 x10E6/uL — ABNORMAL LOW (ref 3.77–5.28)
RDW: 12.9 % (ref 11.7–15.4)
WBC: 6.1 10*3/uL (ref 3.4–10.8)

## 2023-03-25 ENCOUNTER — Telehealth: Payer: Self-pay | Admitting: Cardiovascular Disease

## 2023-03-25 NOTE — Telephone Encounter (Signed)
 Pt sent this via Mychart to the scheduling pool:    I am short of breath when walking. I am also fatigued even when sitting quietly. This has been since the summer.  I saw Dr. Ranee partner, Dr. Dorsey in December. He wants me to have a cardiac MRI. I am waiting for scheduling.   I have an appointment with a pulmonologist, Dr. Vivienne on Jan. 15th.  I have a semi annual checkup with my PCP on Jan. 28th. She checks blood pressure and does lab work.  I think it would be best to wait to see these other providers  before I see Dr. Court as their results may be helpful for him     Good Morning Kimberly Rivers  Can you tell me a little more about your Shortness of Breath  1. Are you currently SOB (can you hear that pt is SOB on the phone)?   2. How long have you been experiencing SOB?   3. Are you SOB when sitting or when up moving around?   4. Are you currently experiencing any other symptoms?      Appointment Request From: Kimberly Rivers  With Provider: Dorn Court [facility]]  Preferred Date Range: 04/19/2023 - 05/15/2023  Preferred Times: Any Time  Reason for visit: Office Visit  Health Maintenance Topic:   Comments: short breath and fatigue

## 2023-03-25 NOTE — Telephone Encounter (Signed)
 Attempted to call patient, no answer left message requesting a call back.

## 2023-04-01 ENCOUNTER — Ambulatory Visit: Payer: PPO | Admitting: Pulmonary Disease

## 2023-04-01 ENCOUNTER — Encounter: Payer: Self-pay | Admitting: Pulmonary Disease

## 2023-04-01 VITALS — BP 148/60 | HR 59 | Temp 97.8°F | Ht 59.0 in | Wt 117.8 lb

## 2023-04-01 DIAGNOSIS — R0602 Shortness of breath: Secondary | ICD-10-CM

## 2023-04-01 MED ORDER — ALBUTEROL SULFATE HFA 108 (90 BASE) MCG/ACT IN AERS: 2.0000 | INHALATION_SPRAY | Freq: Four times a day (QID) | RESPIRATORY_TRACT | 6 refills | Status: AC | PRN

## 2023-04-01 NOTE — Patient Instructions (Signed)
 Will try albuterol  rescue inhaler  This she can use just before activities to help open up the breathing tubes as best as we can get them  You can use 2 puffs up to 4 times a day  Only use it as needed, does not mean you should use it every day  Your recent PET scan-did not show that there was any significant underlying abnormality to your lungs  Your breathing study just shows very minimal obstruction  Call with significant concerns  Follow-up in 3 months

## 2023-04-01 NOTE — Progress Notes (Signed)
 Kimberly Rivers    [id number]    1950/05/24  Primary Care Physician:Wharton, Alayne Hubert  Referring Physician: Darnelle Elders, PA-C [address] [place],  [place] [address]  Chief complaint:   Patient being evaluated for shortness of breath  HPI:  Shortness of breath is largely unchanged since her last visit  Had a pulmonary function test showing very minimal obstruction  Echocardiogram with ventricular hypertrophy, diastolic dysfunction, needs further workup with cardiac MRI  No wheezing  No underlying lung disease known to her -Never smoker, no occupational predisposition to lung disease  She has kyphoscoliosis, spinal fusion when she was about 73 years old  She does have chronic anemia, does not recollect being told that there has been an acute drop  She does have a history of hypertension, diabetes, hypercholesterolemia, history of coronary artery disease for which she had a stent in 2010  Does have osteoarthritis  Has not had any acute back pain or discomfort  She does have a cat for a pet and she has had the cat for few years  She is a retired Comptroller  Lived in [place] most of her life  Outpatient Encounter Medications as of 04/01/2023  Medication Sig   aspirin EC 81 MG tablet Take 81 mg by mouth daily.   atorvastatin  (LIPITOR) 80 MG tablet Take 1 tablet by mouth once daily   Carboxymethylcellulose Sodium (EYE DROPS OP) Apply to eye. Refresh Drops as needed before bed   Carboxymethylcellulose Sodium (THERATEARS OP) Apply to eye in the morning.   Cholecalciferol (VITAMIN D-3 PO) Take 1 Capful by mouth daily.   citalopram (CELEXA) 20 MG tablet Take 20 mg by mouth daily.   CRANBERRY PO Take 4,200 capsules by mouth daily.   enalapril  (VASOTEC ) 5 MG tablet Take 1 tablet by mouth once daily   Ferrous Sulfate (IRON) 325 (65 Fe) MG TABS Take 1 tablet by mouth daily.   gabapentin (NEURONTIN) 300 MG capsule Take 300 mg by mouth at  bedtime.    Lactobacillus (PROBIOTIC ACIDOPHILUS PO) Take by mouth.   lubiprostone (AMITIZA) 24 MCG capsule Take 24 mcg by mouth 2 (two) times daily with a meal.   NIFEdipine  (PROCARDIA  XL/NIFEDICAL XL) 60 MG 24 hr tablet Take 1 tablet (60 mg total) by mouth daily.   oxybutynin (DITROPAN-XL) 5 MG 24 hr tablet Take 5 mg by mouth at bedtime.   PREVIDENT 5000 DRY MOUTH 1.1 % GEL dental gel Place 1 Application onto teeth daily.   repaglinide (PRANDIN) 0.5 MG tablet    vitamin C (ASCORBIC ACID) 500 MG tablet Take 500 mg by mouth daily.   No facility-administered encounter medications on file as of 04/01/2023.    Allergies as of 04/01/2023 - Review Complete 04/01/2023  Allergen Reaction Noted   Clindamycin/lincomycin  01/15/2018   Doxycycline  01/15/2018   Penicillins  01/15/2018    Past Medical History:  Diagnosis Date   Cataract    OD   Chronic kidney disease    Diabetes mellitus without complication (HCC)    Hypertension    Hypertensive retinopathy    OU   Macular degeneration    Dry OU    Past Surgical History:  Procedure Laterality Date   ANGIOPLASTY     CATARACT EXTRACTION Left    EYE SURGERY Left    Cat Sx   SPINAL FUSION     WRIST SURGERY      Family History  Problem Relation  Age of Onset   Hypertension Mother    Heart disease Mother    Diabetes Mother    Canavan disease Father    Heart disease Maternal Grandmother    Heart disease Maternal Grandfather    Kidney failure Maternal Grandfather     Social History   Socioeconomic History   Marital status: Single    Spouse name: Not on file   Number of children: Not on file   Years of education: Not on file   Highest education level: Not on file  Occupational History   Not on file  Tobacco Use   Smoking status: Never   Smokeless tobacco: Never  Vaping Use   Vaping status: Never Used  Substance and Sexual Activity   Alcohol use: Not Currently   Drug use: Never   Sexual activity: Not Currently  Other  Topics Concern   Not on file  Social History Narrative   Not on file   Social Drivers of Health   Financial Resource Strain: Not on file  Food Insecurity: Not on file  Transportation Needs: Not on file  Physical Activity: Not on file  Stress: Not on file  Social Connections: Not on file  Intimate Partner Violence: Not on file    Review of Systems  Respiratory:  Positive for shortness of breath.     Vitals:   04/01/23 1305  BP: (!) 148/60  Pulse: (!) 59  Temp: 97.8 F (36.6 C)  SpO2: 97%     Physical Exam Constitutional:      Appearance: Normal appearance.  HENT:     Head: Normocephalic.     Mouth/Throat:     Mouth: Mucous membranes are moist.  Eyes:     General: No scleral icterus. Cardiovascular:     Rate and Rhythm: Normal rate and regular rhythm.     Heart sounds: No murmur heard.    No friction rub.  Pulmonary:     Effort: No respiratory distress.     Breath sounds: No stridor. No rhonchi.  Musculoskeletal:     Cervical back: No rigidity or tenderness.  Neurological:     Mental Status: She is alert.  Psychiatric:        Mood and Affect: Mood normal.    Data Reviewed: Echocardiogram reviewed  Cardiac PET results reviewed -CT scan of the chest part of the PET scan showed cardiomegaly, restrictive anatomy  Last note with cardiology reviewed -Cardiac MRI ordered  Pulmonary function test with minimal obstruction, no significant bronchodilator response  Assessment:  Shortness of breath -No significant underlying lung disease-with PFT showing minimal obstruction -Will try a rescue inhaler to be used as needed  Deconditioning likely playing a role  With concern for obstructive cardiomyopathy versus hypertrophic cardiomyopathy -Cardiac MRI ordered  Plan/Recommendations: Encouraged to continue graded activity  Follow-up in about 3 months  Prescription for albuterol  sent to pharmacy to be used as needed  So far testing has not shown  significant underlying lung disease has been responsible for her current limitations, gradual deconditioning is likely making symptoms worse as well   Myer Artis MD  Pulmonary and Critical Care 04/01/2023, 1:07 PM  CC: Darnelle Elders, PA-C

## 2023-04-14 NOTE — Progress Notes (Signed)
Patient not seen by me. Dr. Izora Ribas determined that a genetic consult with me was not required at this time.

## 2023-04-27 ENCOUNTER — Encounter (HOSPITAL_COMMUNITY): Payer: Self-pay

## 2023-04-29 ENCOUNTER — Other Ambulatory Visit: Payer: Self-pay | Admitting: Internal Medicine

## 2023-04-29 ENCOUNTER — Ambulatory Visit (HOSPITAL_COMMUNITY)
Admission: RE | Admit: 2023-04-29 | Discharge: 2023-04-29 | Disposition: A | Discharge status: Home / Self Care | Payer: PPO | Source: Ambulatory Visit | Attending: Internal Medicine | Admitting: Internal Medicine

## 2023-04-29 DIAGNOSIS — I517 Cardiomegaly: Secondary | ICD-10-CM | POA: Diagnosis not present

## 2023-04-29 MED ORDER — GADOBUTROL 1 MMOL/ML IV SOLN
8.0000 mL | Freq: Once | INTRAVENOUS | Status: AC | PRN
  Administered 2023-04-29: 8 mL via INTRAVENOUS

## 2023-05-04 ENCOUNTER — Encounter: Payer: Self-pay | Admitting: Cardiovascular Disease

## 2023-05-04 ENCOUNTER — Ambulatory Visit: Payer: PPO | Attending: Cardiovascular Disease | Admitting: Cardiovascular Disease

## 2023-05-04 VITALS — BP 140/36 | HR 62 | Ht 59.0 in | Wt 119.0 lb

## 2023-05-04 DIAGNOSIS — I2510 Atherosclerotic heart disease of native coronary artery without angina pectoris: Secondary | ICD-10-CM

## 2023-05-04 DIAGNOSIS — E782 Mixed hyperlipidemia: Secondary | ICD-10-CM

## 2023-05-04 DIAGNOSIS — I10 Essential (primary) hypertension: Secondary | ICD-10-CM

## 2023-05-04 DIAGNOSIS — I421 Obstructive hypertrophic cardiomyopathy: Secondary | ICD-10-CM

## 2023-05-04 DIAGNOSIS — I6522 Occlusion and stenosis of left carotid artery: Secondary | ICD-10-CM

## 2023-05-04 DIAGNOSIS — R0609 Other forms of dyspnea: Secondary | ICD-10-CM | POA: Insufficient documentation

## 2023-05-04 NOTE — Assessment & Plan Note (Signed)
 Her major complaint is of dyspnea on exertion.  I believe we have ruled out a cardiac etiology with a 2D echo that did show asymmetric septal hypertrophy with a low outflow tract gradient and normal LV function.  She had a normal cardiac PET study and a cardiac MRI that did not support a diagnosis of hypertrophic obstructive cardiomyopathy.  She apparently also has seen a pulmonologist.

## 2023-05-04 NOTE — Patient Instructions (Signed)
 Medication Instructions:  Your physician recommends that you continue on your current medications as directed. Please refer to the Current Medication list given to you today.  *If you need a refill on your cardiac medications before your next appointment, please call your pharmacy*   Testing/Procedures: Your physician has requested that you have a carotid duplex. This test is an ultrasound of the carotid arteries in your neck. It looks at blood flow through these arteries that supply the brain with blood. Allow one hour for this exam. There are no restrictions or special instructions. **To do in September**   Please note: We ask at that you not bring children with you during ultrasound (echo/ vascular) testing. Due to room size and safety concerns, children are not allowed in the ultrasound rooms during exams. Our front office staff cannot provide observation of children in our lobby area while testing is being conducted. An adult accompanying a patient to their appointment will only be allowed in the ultrasound room at the discretion of the ultrasound technician under special circumstances. We apologize for any inconvenience.    Follow-Up: At [facility], you and your health needs are our priority.  As part of our continuing mission to provide you with exceptional heart care, we have created designated Provider Care Teams.  These Care Teams include your primary Cardiologist (physician) and Advanced Practice Providers (APPs -  Physician Assistants and Nurse Practitioners) who all work together to provide you with the care you need, when you need it.  We recommend signing up for the patient portal called "MyChart".  Sign up information is provided on this After Visit Summary.  MyChart is used to connect with patients for Virtual Visits (Telemedicine).  Patients are able to view lab/test results, encounter notes, upcoming appointments, etc.  Non-urgent messages can be sent to your provider  as well.   To learn more about what you can do with MyChart, go to [email or web address].    Your next appointment:   6 month(s)  Provider:   Bernadene Person, NP       Then, Nanetta Batty, MD will plan to see you again in 12 month(s).   Other Instructions

## 2023-05-04 NOTE — Assessment & Plan Note (Signed)
 History of essential hypertension her blood pressure measured today 140/36.  She is on low-dose enalapril, and nifedipine.

## 2023-05-04 NOTE — Assessment & Plan Note (Signed)
 Moderate left ICA stenosis by duplex ultrasound performed 11/26/2022.  This will be repeated on an annual basis.

## 2023-05-04 NOTE — Assessment & Plan Note (Signed)
 History of hyperlipidemia on statin therapy with lipid profile performed 03/05/2022 revealing total cholesterol 152, LDL 60 and HDL of 81.

## 2023-05-04 NOTE — Progress Notes (Signed)
 05/04/2023 Kimberly Rivers   02/05/1951  [id number]  Primary Physician Jarrett Soho, PA-C Primary Cardiologist: Runell Gess MD Nicholes Calamity, [place]  HPI:  Kimberly Rivers is a 73 y.o.   mildly overweight single Caucasian female with no children who relocated from the [place] city to [facility] because of retirement and cost of living.  She was referred by Dr. Susa Simmonds, her PCP, to be established in my practice for ongoing cardiovascular care.  I last saw her in the office 01/05/2023. 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  Risk factors include treated hypertension, diabetes and lipidemia.  Her mother did have a CABG.  She is never had a heart attack or stroke.  She had stenting at [facility], [facility] before 2010 with a Taxus express drug-eluting stent and then stenting 2/29/2010 with 2 Endeavor stents in her LAD.  She had a normal 2D echo and Myoview stress test by her cardiologist in [place] 08/26/2017.    Since I saw her in the office 6 months ago I did refer her to Dr.Chandrasekhar to evaluate hypertrophic obstructive cardiomyopathy.  She did have a cardiac MRI performed 04/29/2023 that did not support this diagnosis.  2D echocardiogram performed 11/07/2022 showed normal LV systolic function (hyperdynamic) with a fairly low outflow tract gradient and moderate asymmetric septal hypertrophy.  A cardiac PET study performed 10/28/2022 was low risk and nonischemic.  Her major complaints have been dyspnea on exertion.  She has been evaluated by pulmonologist as well.     No outpatient medications have been marked as taking for the 05/04/23 encounter (Office Visit) with Runell Gess, MD.     Allergies  Allergen Reactions   Clindamycin/Lincomycin    Doxycycline    Penicillins     Social History   Socioeconomic History   Marital status: Single    Spouse name: Not on file   Number of children: Not on file   Years of education: Not on file   Highest  education level: Not on file  Occupational History   Not on file  Tobacco Use   Smoking status: Never   Smokeless tobacco: Never  Vaping Use   Vaping status: Never Used  Substance and Sexual Activity   Alcohol use: Not Currently   Drug use: Never   Sexual activity: Not Currently  Other Topics Concern   Not on file  Social History Narrative   Not on file   Social Drivers of Health   Financial Resource Strain: Not on file  Food Insecurity: Not on file  Transportation Needs: Not on file  Physical Activity: Not on file  Stress: Not on file  Social Connections: Not on file  Intimate Partner Violence: Not on file     Review of Systems: General: negative for chills, fever, night sweats or weight changes.  Cardiovascular: negative for chest pain, dyspnea on exertion, edema, orthopnea, palpitations, paroxysmal nocturnal dyspnea or shortness of breath Dermatological: negative for rash Respiratory: negative for cough or wheezing Urologic: negative for hematuria Abdominal: negative for nausea, vomiting, diarrhea, bright red blood per rectum, melena, or hematemesis Neurologic: negative for visual changes, syncope, or dizziness All other systems reviewed and are otherwise negative except as noted above.    Blood pressure (!) 140/36, pulse 62, height 4\' 11"  (1.499 m), weight 119 lb (54 kg), SpO2 96%.  General appearance: alert and no distress Neck: no adenopathy, no JVD, supple, symmetrical, trachea midline, thyroid not enlarged,  symmetric, no tenderness/mass/nodules, and bilateral carotid bruits versus transmitted murmur Lungs: clear to auscultation bilaterally Heart: Outflow tract murmur consistent with her asymmetric septal hypertrophy Extremities: extremities normal, atraumatic, no cyanosis or edema Pulses: 2+ and symmetric Skin: Skin color, texture, turgor normal. No rashes or lesions Neurologic: Grossly normal  EKG not performed today      ASSESSMENT AND PLAN:    Essential hypertension History of essential hypertension her blood pressure measured today 140/36.  She is on low-dose enalapril, and nifedipine.  Hyperlipidemia History of hyperlipidemia on statin therapy with lipid profile performed 03/05/2022 revealing total cholesterol 152, LDL 60 and HDL of 81.  Coronary artery disease History of CAD status post stenting at [facility] in [place] in 2010 with Taxus drug-eluting stent and then stenting 2/29/2010 with 2 Endeavor stents in her LAD.  She did have a cardiac PET performed 10/28/2022 which was entirely normal.  Carotid artery disease (HCC) Moderate left ICA stenosis by duplex ultrasound performed 11/26/2022.  This will be repeated on an annual basis.  Obstructive hypertrophic cardiomyopathy (HCC) History of asymmetric septal hypertrophy status post cardiac MRI ordered by Dr.Chandrasekhar that did not meet criteria for hypertrophic obstructive cardiomyopathy.  She has normal LV systolic function with a relatively low outflow track gradient.  Dyspnea on exertion Her major complaint is of dyspnea on exertion.  I believe we have ruled out a cardiac etiology with a 2D echo that did show asymmetric septal hypertrophy with a low outflow tract gradient and normal LV function.  She had a normal cardiac PET study and a cardiac MRI that did not support a diagnosis of hypertrophic obstructive cardiomyopathy.  She apparently also has seen a pulmonologist.     Runell Gess MD [facility], [facility] 05/04/2023 9:21 AM

## 2023-05-04 NOTE — Assessment & Plan Note (Signed)
 History of asymmetric septal hypertrophy status post cardiac MRI ordered by Dr.Chandrasekhar that did not meet criteria for hypertrophic obstructive cardiomyopathy.  She has normal LV systolic function with a relatively low outflow track gradient.

## 2023-05-04 NOTE — Assessment & Plan Note (Signed)
 History of CAD status post stenting at [facility] in [place] in 2010 with Taxus drug-eluting stent and then stenting 2/29/2010 with 2 Endeavor stents in her LAD.  She did have a cardiac PET performed 10/28/2022 which was entirely normal.

## 2023-05-18 ENCOUNTER — Encounter: Payer: Self-pay | Admitting: Cardiovascular Disease

## 2023-05-18 ENCOUNTER — Encounter: Payer: Self-pay | Admitting: Pulmonary Disease

## 2023-06-16 ENCOUNTER — Encounter (INDEPENDENT_AMBULATORY_CARE_PROVIDER_SITE_OTHER): Payer: PPO | Admitting: Ophthalmology

## 2023-06-16 DIAGNOSIS — H4421 Degenerative myopia, right eye: Secondary | ICD-10-CM

## 2023-06-16 DIAGNOSIS — E119 Type 2 diabetes mellitus without complications: Secondary | ICD-10-CM

## 2023-06-16 DIAGNOSIS — H353132 Nonexudative age-related macular degeneration, bilateral, intermediate dry stage: Secondary | ICD-10-CM

## 2023-06-16 DIAGNOSIS — Z961 Presence of intraocular lens: Secondary | ICD-10-CM

## 2023-06-16 DIAGNOSIS — I10 Essential (primary) hypertension: Secondary | ICD-10-CM

## 2023-06-16 DIAGNOSIS — H35033 Hypertensive retinopathy, bilateral: Secondary | ICD-10-CM

## 2023-06-16 DIAGNOSIS — H33102 Unspecified retinoschisis, left eye: Secondary | ICD-10-CM

## 2023-06-29 NOTE — Progress Notes (Signed)
 [facility] - Clinic Note  07/01/2023     CHIEF COMPLAINT Patient presents for Retina Follow Up    HISTORY OF PRESENT ILLNESS: Kimberly Rivers is a 73 y.o. female who presents to the clinic today for:   HPI     Retina Follow Up   Patient presents with  Diabetic Retinopathy.  In both eyes.  Severity is mild.  Duration of 1 year.  Since onset it is stable.  I, the attending physician,  performed the HPI with the patient and updated documentation appropriately.        Comments   1 year Retina eval. Patient states vision is doing well since Cat surgery in the right eye      Last edited by Ronelle Coffee, MD on 07/03/2023  3:29 PM.     Pt states starting last summer she started experiencing shortness of breath, she went to the dr and was told it's cardiomyopathy, she went to a pulmonologist who told her if she really needed to exert herself, she should use an inhaler, she states her vision is stable  Referring physician: Darnelle Elders, PA-C [address] [place],  [place] [address]  HISTORICAL INFORMATION:   Selected notes from the MEDICAL RECORD NUMBER Referred by [facility] for DM exam LEE: 06.17.19 (Dr. Prescilla Brod, MD) [BCVA: OD: 20/25 OS: 20/30--]  Ocular Hx-non-exu ARMD, cataract OS, pseudo OD PMH-DM, CKD, HTN    CURRENT MEDICATIONS: Current Outpatient Medications (Ophthalmic Drugs)  Medication Sig   Carboxymethylcellulose Sodium (EYE DROPS OP) Apply to eye. Refresh Drops as needed before bed   Carboxymethylcellulose Sodium (THERATEARS OP) Apply to eye in the morning.   No current facility-administered medications for this visit. (Ophthalmic Drugs)   Current Outpatient Medications (Other)  Medication Sig   albuterol  (VENTOLIN  HFA) 108 (90 Base) MCG/ACT inhaler Inhale 2 puffs into the lungs every 6 (six) hours as needed for wheezing or shortness of breath.   aspirin EC 81 MG tablet Take 81 mg by mouth daily.   atorvastatin   (LIPITOR) 80 MG tablet Take 1 tablet by mouth once daily   Cholecalciferol (VITAMIN D-3 PO) Take 1 Capful by mouth daily.   citalopram (CELEXA) 20 MG tablet Take 20 mg by mouth daily.   CRANBERRY PO Take 4,200 capsules by mouth daily.   enalapril  (VASOTEC ) 5 MG tablet Take 1 tablet by mouth once daily   Ferrous Sulfate (IRON) 325 (65 Fe) MG TABS Take 1 tablet by mouth daily.   gabapentin (NEURONTIN) 300 MG capsule Take 300 mg by mouth at bedtime.    Lactobacillus (PROBIOTIC ACIDOPHILUS PO) Take by mouth.   lubiprostone (AMITIZA) 24 MCG capsule Take 24 mcg by mouth 2 (two) times daily with a meal.   NIFEdipine  (PROCARDIA  XL/NIFEDICAL XL) 60 MG 24 hr tablet Take 1 tablet (60 mg total) by mouth daily.   oxybutynin (DITROPAN-XL) 5 MG 24 hr tablet Take 5 mg by mouth at bedtime.   PREVIDENT 5000 DRY MOUTH 1.1 % GEL dental gel Place 1 Application onto teeth daily.   repaglinide (PRANDIN) 0.5 MG tablet    vitamin C (ASCORBIC ACID) 500 MG tablet Take 500 mg by mouth daily.   No current facility-administered medications for this visit. (Other)   REVIEW OF SYSTEMS: ROS   Positive for: Endocrine, Cardiovascular, Eyes Negative for: Constitutional, Gastrointestinal, Neurological, Skin, Genitourinary, Musculoskeletal, HENT, Respiratory, Psychiatric, Allergic/Imm, Heme/Lymph Last edited by Leonia Raman, COT on 07/01/2023 12:28 PM.  ALLERGIES Allergies  Allergen Reactions   Clindamycin/Lincomycin    Doxycycline    Penicillins     PAST MEDICAL HISTORY Past Medical History:  Diagnosis Date   Cataract    OU   Chronic kidney disease    Diabetes mellitus without complication (HCC)    Hypertension    Hypertensive retinopathy    OU   Macular degeneration    Dry OU   Past Surgical History:  Procedure Laterality Date   ANGIOPLASTY     CATARACT EXTRACTION Left    EYE SURGERY Left    Cat Sx   SPINAL FUSION     WRIST SURGERY     FAMILY HISTORY Family History  Problem Relation Age  of Onset   Hypertension Mother    Heart disease Mother    Diabetes Mother    Canavan disease Father    Heart disease Maternal Grandmother    Heart disease Maternal Grandfather    Kidney failure Maternal Grandfather    SOCIAL HISTORY Social History   Tobacco Use   Smoking status: Never   Smokeless tobacco: Never  Vaping Use   Vaping status: Never Used  Substance Use Topics   Alcohol use: Not Currently   Drug use: Never       OPHTHALMIC EXAM:  Base Eye Exam     Visual Acuity (Snellen - Linear)       Right Left   Dist cc 20/25 20/25   Dist ph cc 20/NI 20/NI    Correction: Glasses         Tonometry (Tonopen, 12:45 PM)       Right Left   Pressure 10 11         Pupils       Dark Light Shape React APD   Right 3 2 Round Brisk None   Left 3 2 Round Brisk None         Visual Fields (Counting fingers)       Left Right    Full Full         Extraocular Movement       Right Left    Full, Ortho Full, Ortho         Neuro/Psych     Oriented x3: Yes   Mood/Affect: Normal         Dilation     Both eyes: 1.0% Mydriacyl, 2.5% Phenylephrine @ 12:45 PM           Slit Lamp and Fundus Exam     Slit Lamp Exam       Right Left   Lids/Lashes Dermatochalasis - upper lid Dermatochalasis - upper lid   Conjunctiva/Sclera White and quiet White and quiet   Cornea Arcus, trace Punctate epithelial erosions, well healed cataract wound, trace Descemet's folds well healed cataract wound, trace Descemet's folds, trace tear film debris   Anterior Chamber deep and clear deep and clear   Iris Round and dilated, No NVI Round and dilated, No NVI   Lens PC IOL in good position, trace Posterior capsular opacification PC IOL in good position with open PC   Anterior Vitreous Mild syneresis, Posterior vitreous detachment, vitreous condensations Vitreous syneresis, Posterior vitreous detachment         Fundus Exam       Right Left   Disc Compact, mild  Peripapillary atrophy, mild pallor, Sharp rim Pink and Sharp, tilted, temporal PPA   C/D Ratio 0.2 0.3   Macula Flat, Blunted foveal reflex, RPE mottling and  clumping, Drusen, focal atrophy nasal to fovea, no heme, +PED Flat, good foveal reflex, fine drusen, mild Retinal pigment epithelial mottling, No heme or edema   Vessels attenuated, Tortuous attenuated, Tortuous, copper wiring   Periphery Attached, no heme Attached, No heme, peripheral cystoid degeneration           Refraction     Wearing Rx       Sphere Cylinder Axis Add   Right -0.50 +0.75 175 +2.75   Left +1.75   +2.75           IMAGING AND PROCEDURES  Imaging and Procedures for @TODAY @  OCT, Retina - OU - Both Eyes       Right Eye Quality was good. Central Foveal Thickness: 301. Progression has been stable. Findings include normal foveal contour, no IRF, no SRF, retinal drusen , pigment epithelial detachment, outer retinal atrophy (Persistent central PED with adjacent focal ORA, no significant change from prior).   Left Eye Quality was good. Central Foveal Thickness: 279. Progression has been stable. Findings include normal foveal contour, no IRF, no SRF, retinal drusen (Very shallow peripheral schisis temporal periphery caught on widefield -- not imaged today).   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 CME - cystoid macular edema. PED - pigment epithelial detachment. IRF - intraretinal fluid. SRF - subretinal fluid. EZ - ellipsoid zone. ERM - epiretinal membrane. ORA - outer retinal atrophy. ORT - outer retinal tubulation. SRHM - subretinal hyper-reflective material             ASSESSMENT/PLAN:    ICD-10-CM   1. Diabetes mellitus type 2 without retinopathy (HCC)  E11.9 OCT, Retina - OU - Both Eyes    2. Intermediate stage nonexudative age-related macular degeneration of both eyes  H35.3132 OCT, Retina - OU - Both Eyes    3. 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 Essential hypertension  I10     6. Hypertensive retinopathy of both eyes  H35.033     7. Pseudophakia, both eyes  Z96.1      1. Diabetes mellitus, type 2 without retinopathy, OU  - A1c: 6.3 on 02.02.24  - The incidence, risk factors for progression, natural history and treatment options for diabetic retinopathy  were discussed with patient.    - The need for close monitoring of blood glucose, blood pressure, and serum lipids, avoiding cigarette or any type of tobacco, and the need for long term follow up was also discussed with patient.  - BCVA stable OU (20/25 OD, 20/30 OS)  - OCT without DME OU  - monitor  - f/u 1 yr - DFE/OCT  2. Age related macular degeneration, non-exudative, both eyes -- stable  - The incidence, anatomy, and pathology of dry AMD, risk of progression, and the AREDS and AREDS 2 study including smoking risks discussed with patient.  - OCT and exam show stable PED OD, will f/u in 6-9 months  - continue amsler grid monitoring  - continue AREDS 2 supplements  - f/u 1 yr - DFE, OCT  3. Myopic degeneration OD  - mild low lying PED w/  overlying ORA nasal to fovea  - stable without IRF/SRF  - monitor  4. Shallow peripheral schisis OS  - OCT with shallow schisis temporal periphery caught on widefield -- stable  - monitor  5,6. Hypertensive retinopathy OU  - discussed importance of tight BP control  - monitor  7. Pseudophakia OU  - s/p CE/IOL OD (April 2023 with Dr. Allison Ivory)  - s/p CE/IOL and YAG cap OS (2017, Dr. Filomena Huge. [place]on in [place] )  - beautiful surgeries, doing well  - monitor  Ophthalmic Meds Ordered this visit:  No  orders of the defined types were placed in this encounter.   Return in about 1 year (around 06/30/2024) for f/u DM exam / non-exu ARMD OU, DFE, OCT.  There are no Patient Instructions on file for this visit.  This document serves as a record of services personally performed by Jeanice Millard, MD, PhD. It was created on their behalf by Morley Arabia. Bevin Bucks, OA an ophthalmic technician. The creation of this record is the provider's dictation and/or activities during the visit.    Electronically signed by: Morley Arabia. Bevin Bucks, OA 07/03/23 3:33 PM  Jeanice Millard, M.D., Ph.D. Diseases & Surgery of the Retina and Vitreous [facility]  I have reviewed the above documentation for accuracy and completeness, and I agree with the above. Jeanice Millard, M.D., Ph.D. 07/03/23 3:36 PM   Abbreviations: M myopia (nearsighted); A astigmatism; H hyperopia (farsighted); P presbyopia; Mrx spectacle prescription;  CTL contact lenses; OD right eye; OS left eye; OU both eyes  XT exotropia; ET esotropia; PEK punctate epithelial keratitis; PEE punctate epithelial erosions; DES dry eye syndrome; MGD meibomian gland dysfunction; ATs artificial tears; PFAT's preservative free artificial tears; NSC nuclear sclerotic cataract; PSC posterior subcapsular cataract; ERM epi-retinal membrane; PVD posterior vitreous detachment; RD retinal detachment; DM diabetes mellitus; DR diabetic retinopathy; NPDR non-proliferative diabetic retinopathy; PDR proliferative diabetic retinopathy; CSME clinically significant macular edema; DME diabetic macular edema; dbh dot blot hemorrhages; CWS cotton wool spot; POAG primary open angle glaucoma; C/D cup-to-disc ratio; HVF humphrey visual field; GVF goldmann visual field; OCT optical coherence tomography; IOP intraocular pressure; BRVO Branch retinal vein occlusion; CRVO central retinal vein occlusion; CRAO central retinal artery occlusion; BRAO branch retinal artery occlusion; RT  retinal tear; SB scleral buckle; PPV pars plana vitrectomy; VH Vitreous hemorrhage; PRP panretinal laser photocoagulation; IVK intravitreal kenalog ; VMT vitreomacular traction; MH Macular hole;  NVD neovascularization of the disc; NVE neovascularization elsewhere; AREDS age related eye disease study; ARMD age related macular degeneration; POAG primary open angle glaucoma; EBMD epithelial/anterior basement membrane dystrophy; ACIOL anterior chamber intraocular lens; IOL intraocular lens; PCIOL posterior chamber intraocular lens; Phaco/IOL phacoemulsification with intraocular lens placement; PRK photorefractive keratectomy; LASIK laser assisted in situ keratomileusis; HTN hypertension; DM diabetes mellitus; COPD chronic obstructive pulmonary disease

## 2023-07-01 ENCOUNTER — Ambulatory Visit (INDEPENDENT_AMBULATORY_CARE_PROVIDER_SITE_OTHER): Admitting: Ophthalmology

## 2023-07-01 ENCOUNTER — Encounter (INDEPENDENT_AMBULATORY_CARE_PROVIDER_SITE_OTHER): Payer: Self-pay | Admitting: Ophthalmology

## 2023-07-01 DIAGNOSIS — H4421 Degenerative myopia, right eye: Secondary | ICD-10-CM | POA: Diagnosis not present

## 2023-07-01 DIAGNOSIS — E119 Type 2 diabetes mellitus without complications: Secondary | ICD-10-CM

## 2023-07-01 DIAGNOSIS — I10 Essential (primary) hypertension: Secondary | ICD-10-CM | POA: Diagnosis not present

## 2023-07-01 DIAGNOSIS — H353132 Nonexudative age-related macular degeneration, bilateral, intermediate dry stage: Secondary | ICD-10-CM

## 2023-07-01 DIAGNOSIS — H33102 Unspecified retinoschisis, left eye: Secondary | ICD-10-CM

## 2023-07-01 DIAGNOSIS — H269 Unspecified cataract: Secondary | ICD-10-CM | POA: Insufficient documentation

## 2023-07-01 DIAGNOSIS — Z961 Presence of intraocular lens: Secondary | ICD-10-CM | POA: Diagnosis not present

## 2023-07-01 DIAGNOSIS — H35033 Hypertensive retinopathy, bilateral: Secondary | ICD-10-CM | POA: Diagnosis not present

## 2023-07-03 ENCOUNTER — Encounter (INDEPENDENT_AMBULATORY_CARE_PROVIDER_SITE_OTHER): Payer: Self-pay | Admitting: Ophthalmology

## 2023-07-07 ENCOUNTER — Telehealth: Payer: Self-pay

## 2023-07-07 ENCOUNTER — Ambulatory Visit: Payer: PPO | Admitting: Pulmonary Disease

## 2023-07-07 ENCOUNTER — Encounter: Payer: Self-pay | Admitting: Pulmonary Disease

## 2023-07-07 VITALS — BP 130/62 | HR 72 | Ht 59.0 in | Wt 114.8 lb

## 2023-07-07 DIAGNOSIS — R0602 Shortness of breath: Secondary | ICD-10-CM | POA: Diagnosis not present

## 2023-07-07 NOTE — Patient Instructions (Signed)
 I will see you back in about a year from now  Continue to use albuterol  as needed  Continue to stay active as tolerated  Make sure you follow-up with a cardiologist  Call us  with significant concerns

## 2023-07-07 NOTE — Progress Notes (Signed)
 Kimberly Rivers    [id number]    June 25, 1950  Primary Care Physician:Wharton, Alayne Hubert  Referring Physician: Darnelle Elders, PA-C [address] [place],  [place] [address]  Chief complaint:   Patient being evaluated for shortness of breath  HPI:  No significant change in her shortness of breath  Only uses albuterol  as needed  Noted to have cardiomyopathy for which she will be following up with cardiology  Denies any chest pains or chest discomfort, no wheezing  Never smoker, no occupational predisposition to lung disease  She has kyphoscoliosis, spinal fusion when she was about 73 years old  She does have chronic anemia, does not recollect being told that there has been an acute drop  She does have a history of hypertension, diabetes, hypercholesterolemia, history of coronary artery disease for which she had a stent in 2010  Has chronic back pain She does have a cat for a pet and she has had the cat for few years  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   aspirin EC 81 MG tablet Take 81 mg by mouth daily.   atorvastatin  (LIPITOR) 80 MG tablet Take 1 tablet by mouth once daily   Carboxymethylcellulose Sodium (EYE DROPS OP) Apply to eye. Refresh Drops as needed before bed   Carboxymethylcellulose Sodium (THERATEARS OP) Apply to eye in the morning.   Cholecalciferol (VITAMIN D-3 PO) Take 1 Capful by mouth daily.   citalopram (CELEXA) 20 MG tablet Take 20 mg by mouth daily.   CRANBERRY PO Take 4,200 capsules by mouth daily.   enalapril  (VASOTEC ) 5 MG tablet Take 1 tablet by mouth once daily   Ferrous Sulfate (IRON) 325 (65 Fe) MG TABS Take 1 tablet by mouth daily.   gabapentin (NEURONTIN) 300 MG  capsule Take 300 mg by mouth at bedtime.    Lactobacillus (PROBIOTIC ACIDOPHILUS PO) Take by mouth.   lubiprostone (AMITIZA) 24 MCG capsule Take 24 mcg by mouth 2 (two) times daily with a meal.   NIFEdipine  (PROCARDIA  XL/NIFEDICAL XL) 60 MG 24 hr tablet Take 1 tablet (60 mg total) by mouth daily.   oxybutynin (DITROPAN-XL) 5 MG 24 hr tablet Take 5 mg by mouth at bedtime.   PREVIDENT 5000 DRY MOUTH 1.1 % GEL dental gel Place 1 Application onto teeth daily.   repaglinide (PRANDIN) 0.5 MG tablet    vitamin C (ASCORBIC ACID) 500 MG tablet Take 500 mg by mouth daily.   No facility-administered encounter medications on file as of 07/07/2023.    Allergies as of 07/07/2023 - Review Complete 07/07/2023  Allergen Reaction Noted   Clindamycin/lincomycin  01/15/2018   Doxycycline  01/15/2018   Penicillins  01/15/2018    Past Medical History:  Diagnosis Date   Cataract    OU   Chronic kidney disease    Diabetes mellitus without complication (HCC)    Hypertension    Hypertensive retinopathy    OU   Macular degeneration    Dry OU    Past Surgical History:  Procedure Laterality Date   ANGIOPLASTY     CATARACT EXTRACTION Left    EYE SURGERY Left    Cat Sx   SPINAL FUSION  WRIST SURGERY      Family History  Problem Relation Age of Onset   Hypertension Mother    Heart disease Mother    Diabetes Mother    Canavan disease Father    Heart disease Maternal Grandmother    Heart disease Maternal Grandfather    Kidney failure Maternal Grandfather     Social History   Socioeconomic History   Marital status: Single    Spouse name: Not on file   Number of children: Not on file   Years of education: Not on file   Highest education level: Not on file  Occupational History   Not on file  Tobacco Use   Smoking status: Never   Smokeless tobacco: Never  Vaping Use   Vaping status: Never Used  Substance and Sexual Activity   Alcohol use: Not Currently   Drug use: Never   Sexual  activity: Not Currently  Other Topics Concern   Not on file  Social History Narrative   Not on file   Social Drivers of Health   Financial Resource Strain: Not on file  Food Insecurity: Not on file  Transportation Needs: Not on file  Physical Activity: Not on file  Stress: Not on file  Social Connections: Not on file  Intimate Partner Violence: Not on file    Review of Systems  Respiratory:  Positive for shortness of breath.     Vitals:   07/07/23 1430  BP: 130/62  Pulse: 72  SpO2: 95%     Physical Exam Constitutional:      Appearance: Normal appearance.  HENT:     Head: Normocephalic.     Mouth/Throat:     Mouth: Mucous membranes are moist.  Eyes:     General: No scleral icterus. Cardiovascular:     Rate and Rhythm: Normal rate and regular rhythm.     Heart sounds: No murmur heard.    No friction rub.  Pulmonary:     Effort: No respiratory distress.     Breath sounds: No stridor. No wheezing or rhonchi.  Musculoskeletal:     Cervical back: No rigidity or tenderness.  Neurological:     Mental Status: She is alert.  Psychiatric:        Mood and Affect: Mood normal.    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

## 2023-08-19 DIAGNOSIS — I2510 Atherosclerotic heart disease of native coronary artery without angina pectoris: Secondary | ICD-10-CM | POA: Diagnosis not present

## 2023-08-19 DIAGNOSIS — I70209 Unspecified atherosclerosis of native arteries of extremities, unspecified extremity: Secondary | ICD-10-CM | POA: Diagnosis not present

## 2023-08-19 DIAGNOSIS — I10 Essential (primary) hypertension: Secondary | ICD-10-CM | POA: Diagnosis not present

## 2023-08-19 DIAGNOSIS — N1832 Chronic kidney disease, stage 3b: Secondary | ICD-10-CM | POA: Diagnosis not present

## 2023-09-14 DIAGNOSIS — I2510 Atherosclerotic heart disease of native coronary artery without angina pectoris: Secondary | ICD-10-CM | POA: Diagnosis not present

## 2023-09-14 DIAGNOSIS — N1832 Chronic kidney disease, stage 3b: Secondary | ICD-10-CM | POA: Diagnosis not present

## 2023-09-14 DIAGNOSIS — I10 Essential (primary) hypertension: Secondary | ICD-10-CM | POA: Diagnosis not present

## 2023-09-14 DIAGNOSIS — I70209 Unspecified atherosclerosis of native arteries of extremities, unspecified extremity: Secondary | ICD-10-CM | POA: Diagnosis not present

## 2023-09-14 DIAGNOSIS — F3341 Major depressive disorder, recurrent, in partial remission: Secondary | ICD-10-CM | POA: Diagnosis not present

## 2023-09-14 DIAGNOSIS — E663 Overweight: Secondary | ICD-10-CM | POA: Diagnosis not present

## 2023-09-17 DIAGNOSIS — I2510 Atherosclerotic heart disease of native coronary artery without angina pectoris: Secondary | ICD-10-CM | POA: Diagnosis not present

## 2023-09-17 DIAGNOSIS — N1832 Chronic kidney disease, stage 3b: Secondary | ICD-10-CM | POA: Diagnosis not present

## 2023-09-17 DIAGNOSIS — I10 Essential (primary) hypertension: Secondary | ICD-10-CM | POA: Diagnosis not present

## 2023-09-17 DIAGNOSIS — I70209 Unspecified atherosclerosis of native arteries of extremities, unspecified extremity: Secondary | ICD-10-CM | POA: Diagnosis not present

## 2023-10-06 DIAGNOSIS — K5900 Constipation, unspecified: Secondary | ICD-10-CM | POA: Diagnosis not present

## 2023-10-15 DIAGNOSIS — N3281 Overactive bladder: Secondary | ICD-10-CM | POA: Diagnosis not present

## 2023-10-15 DIAGNOSIS — K5904 Chronic idiopathic constipation: Secondary | ICD-10-CM | POA: Diagnosis not present

## 2023-10-15 DIAGNOSIS — I700 Atherosclerosis of aorta: Secondary | ICD-10-CM | POA: Diagnosis not present

## 2023-10-15 DIAGNOSIS — E1169 Type 2 diabetes mellitus with other specified complication: Secondary | ICD-10-CM | POA: Diagnosis not present

## 2023-10-15 DIAGNOSIS — Z1331 Encounter for screening for depression: Secondary | ICD-10-CM | POA: Diagnosis not present

## 2023-10-15 DIAGNOSIS — I10 Essential (primary) hypertension: Secondary | ICD-10-CM | POA: Diagnosis not present

## 2023-10-15 DIAGNOSIS — E2839 Other primary ovarian failure: Secondary | ICD-10-CM | POA: Diagnosis not present

## 2023-10-15 DIAGNOSIS — F3341 Major depressive disorder, recurrent, in partial remission: Secondary | ICD-10-CM | POA: Diagnosis not present

## 2023-10-15 DIAGNOSIS — E785 Hyperlipidemia, unspecified: Secondary | ICD-10-CM | POA: Diagnosis not present

## 2023-10-15 DIAGNOSIS — I2510 Atherosclerotic heart disease of native coronary artery without angina pectoris: Secondary | ICD-10-CM | POA: Diagnosis not present

## 2023-10-15 DIAGNOSIS — E663 Overweight: Secondary | ICD-10-CM | POA: Diagnosis not present

## 2023-10-15 DIAGNOSIS — N1832 Chronic kidney disease, stage 3b: Secondary | ICD-10-CM | POA: Diagnosis not present

## 2023-10-15 DIAGNOSIS — I70209 Unspecified atherosclerosis of native arteries of extremities, unspecified extremity: Secondary | ICD-10-CM | POA: Diagnosis not present

## 2023-10-15 DIAGNOSIS — Z6822 Body mass index (BMI) 22.0-22.9, adult: Secondary | ICD-10-CM | POA: Diagnosis not present

## 2023-10-17 DIAGNOSIS — I2510 Atherosclerotic heart disease of native coronary artery without angina pectoris: Secondary | ICD-10-CM | POA: Diagnosis not present

## 2023-10-17 DIAGNOSIS — I70209 Unspecified atherosclerosis of native arteries of extremities, unspecified extremity: Secondary | ICD-10-CM | POA: Diagnosis not present

## 2023-10-17 DIAGNOSIS — N1832 Chronic kidney disease, stage 3b: Secondary | ICD-10-CM | POA: Diagnosis not present

## 2023-10-17 DIAGNOSIS — I10 Essential (primary) hypertension: Secondary | ICD-10-CM | POA: Diagnosis not present

## 2023-11-10 ENCOUNTER — Ambulatory Visit: Attending: Nurse Practitioner | Admitting: Nurse Practitioner

## 2023-11-10 ENCOUNTER — Encounter: Payer: Self-pay | Admitting: Nurse Practitioner

## 2023-11-10 VITALS — BP 132/66 | HR 48 | Ht 59.0 in | Wt 112.2 lb

## 2023-11-10 DIAGNOSIS — E785 Hyperlipidemia, unspecified: Secondary | ICD-10-CM

## 2023-11-10 DIAGNOSIS — R001 Bradycardia, unspecified: Secondary | ICD-10-CM | POA: Diagnosis not present

## 2023-11-10 DIAGNOSIS — I2510 Atherosclerotic heart disease of native coronary artery without angina pectoris: Secondary | ICD-10-CM | POA: Diagnosis not present

## 2023-11-10 DIAGNOSIS — I6523 Occlusion and stenosis of bilateral carotid arteries: Secondary | ICD-10-CM

## 2023-11-10 DIAGNOSIS — D638 Anemia in other chronic diseases classified elsewhere: Secondary | ICD-10-CM | POA: Diagnosis not present

## 2023-11-10 DIAGNOSIS — R0609 Other forms of dyspnea: Secondary | ICD-10-CM

## 2023-11-10 DIAGNOSIS — I517 Cardiomegaly: Secondary | ICD-10-CM

## 2023-11-10 DIAGNOSIS — I10 Essential (primary) hypertension: Secondary | ICD-10-CM

## 2023-11-10 NOTE — Progress Notes (Unsigned)
 Office Visit    Patient Name: Kimberly Rivers Date of Encounter: 11/10/2023  Primary Care Provider:  Katina Pfeiffer, PA-C Primary Cardiologist:  Dorn Lesches, MD  Chief Complaint    73 year old female with a history of CAD s/p DES x 2-mLAD in 2010 in [place], carotid artery disease, hypertension, hyperlipidemia, and type 2 diabetes who presents for follow-up related to CAD.    Past Medical History    Past Medical History:  Diagnosis Date   Cataract    OU   Chronic kidney disease    Diabetes mellitus without complication (HCC)    Hypertension    Hypertensive retinopathy    OU   Macular degeneration    Dry OU   Past Surgical History:  Procedure Laterality Date   ANGIOPLASTY     CATARACT EXTRACTION Left    EYE SURGERY Left    Cat Sx   SPINAL FUSION     WRIST SURGERY      Allergies  Allergies  Allergen Reactions   Clindamycin/Lincomycin    Doxycycline    Penicillins      Labs/Other Studies Reviewed    The following studies were reviewed today:  Cardiac Studies & Procedures   ______________________________________________________________________________________________   STRESS TESTS  NM PET CT CARDIAC PERFUSION MULTI W/ABSOLUTE BLOODFLOW 10/28/2022  Narrative   The study is normal. The study is low risk.   LV perfusion is normal. There is no evidence of ischemia. There is no evidence of infarction.   Rest left ventricular function is normal. Rest EF: 65%. Stress EF: 75%. End diastolic cavity size is normal. End systolic cavity size is normal.   Myocardial blood flow was computed to be 0.87ml/g/min at rest and 2.20ml/g/min at stress. Global myocardial blood flow reserve was 2.75 and was normal.   Coronary calcium  assessment not performed due to prior revascularization.   Electronically Signed: Jerel Balding, MD  EXAM: OVER-READ INTERPRETATION  CT CHEST  The following report is a limited chest CT over-read performed by radiologist Dr. Elsie Ko [facility] on 10/28/2022. This over-read does not include interpretation of cardiac or coronary anatomy or pathology nor does it include evaluation of the PET data. The cardiac PET-CT interpretation by the cardiologist is attached.  COMPARISON:  None.  FINDINGS: Mediastinum/Nodes: No enlarged lymph nodes within the visualized mediastinum.Aortic atherosclerosis with possible calcifications of the aortic valve.  Lungs/Pleura: No pleural effusion or basilar pneumothorax. Tiny calcified left lower lobe granuloma and mild linear atelectasis or scarring at the left lung base. The visualized lung bases are otherwise clear.  Upper abdomen: The spleen is incompletely visualized, although appears prominent, measuring up to 12.9 cm in length. Probable cyst peripherally in the right hepatic lobe.  Musculoskeletal/Chest wall: No chest wall mass or suspicious osseous findings within the visualized chest. Thoracolumbar scoliosis with evidence of previous multilevel posterior thoracic surgical fusion.  IMPRESSION: 1. No acute extra cardiac findings identified. 2. The spleen is incompletely visualized, although appears prominent. 3. Thoracolumbar scoliosis with evidence of previous multilevel posterior thoracic surgical fusion. 4.  Aortic Atherosclerosis (ICD10-I70.0).   Electronically Signed By: Elsie Perone M.D. On: 10/28/2022 13:37   ECHOCARDIOGRAM  ECHOCARDIOGRAM COMPLETE 11/07/2022  Narrative ECHOCARDIOGRAM REPORT    [name] Date of Exam: 11/07/2022 Medical Rec #:  [id number]          Height:       59.0 in Accession #:    [id number]  Weight:       127.2 lb Date of Birth:  06-Jul-1950         BSA:          1.522 m Patient Age:    71 years           BP:           132/52 mmHg Patient Gender: F                  HR:           48 bpm. Exam Location:  Outpatient  Procedure: 2D Echo, 3D Echo, Color Doppler and Cardiac  Doppler  Indications:    R06.9 DOE  History:        Patient has prior history of Echocardiogram examinations, most recent 01/18/2021. CAD, Signs/Symptoms:Dyspnea; Risk Factors:Hypertension, Dyslipidemia and Non-Smoker. Patient denies chest pain and leg edema. She does have DOE.  Sonographer:    Annabella Cater RVT, RDCS (AE), RDMS Referring Phys: [id number] JONATHAN J BERRY  IMPRESSIONS   1. Left ventricular ejection fraction, by estimation, is 70 to 75%. Left ventricular ejection fraction by 3D volume is 73 %. The left ventricle has hyperdynamic function. The left ventricle has no regional wall motion abnormalities. There is moderate asymmetric left ventricular hypertrophy of the basal-septal segment. Left ventricular diastolic parameters are consistent with Grade I diastolic dysfunction (impaired relaxation). 2. Right ventricular systolic function is hyperdynamic. The right ventricular size is normal. 3. Left atrial size was moderately dilated. 4. The mitral valve is normal in structure. No evidence of mitral valve regurgitation. No evidence of mitral stenosis. 5. The aortic valve is tricuspid. Aortic valve regurgitation is not visualized. No aortic stenosis is present. 6. The inferior vena cava is normal in size with greater than 50% respiratory variability, suggesting right atrial pressure of 3 mmHg.  Comparison(s): EF 65-70%, intraventricular gradient. Similar function and gradients.  FINDINGS Left Ventricle: There is a small < 30 mm HG intracavitary gradient with Valsalva. Left ventricular ejection fraction, by estimation, is 70 to 75%. Left ventricular ejection fraction by 3D volume is 73 %. The left ventricle has hyperdynamic function. The left ventricle has no regional wall motion abnormalities. The left ventricular internal cavity size was normal in size. There is moderate asymmetric left ventricular hypertrophy of the basal-septal segment. Left ventricular diastolic parameters  are consistent with Grade I diastolic dysfunction (impaired relaxation).  Right Ventricle: The right ventricular size is normal. No increase in right ventricular wall thickness. Right ventricular systolic function is hyperdynamic.  Left Atrium: Left atrial size was moderately dilated.  Right Atrium: Right atrial size was normal in size.  Pericardium: Trivial pericardial effusion is present. The pericardial effusion is lateral to the left ventricle.  Mitral Valve: The mitral valve is normal in structure. No evidence of mitral valve regurgitation. No evidence of mitral valve stenosis.  Tricuspid Valve: The tricuspid valve is normal in structure. Tricuspid valve regurgitation is trivial. No evidence of tricuspid stenosis.  Aortic Valve: The aortic valve is tricuspid. Aortic valve regurgitation is not visualized. No aortic stenosis is present. Aortic valve mean gradient measures 6.5 mmHg. Aortic valve peak gradient measures 12.0 mmHg. Aortic valve area, by VTI measures 1.66 cm.  Pulmonic Valve: The pulmonic valve was normal in structure. Pulmonic valve regurgitation is mild. No evidence of pulmonic stenosis.  Aorta: The aortic root and ascending aorta are structurally normal, with no evidence of dilitation.  Venous: The inferior vena cava is normal in size with greater  than 50% respiratory variability, suggesting right atrial pressure of 3 mmHg.  IAS/Shunts: No atrial level shunt detected by color flow Doppler.   LEFT VENTRICLE PLAX 2D LVIDd:         3.82 cm         Diastology LVIDs:         1.47 cm         LV e' medial:    5.98 cm/s LV PW:         1.03 cm         LV E/e' medial:  15.2 LV IVS:        1.10 cm         LV e' lateral:   6.85 cm/s LVOT diam:     1.60 cm         LV E/e' lateral: 13.2 LV SV:         65 LV SV Index:   43 LVOT Area:     2.01 cm        3D Volume EF LV 3D EF:    Left ventricul ar ejection fraction by 3D volume is 73 %.  3D Volume EF: 3D EF:         73 % LV EDV:       101 ml LV ESV:       28 ml LV SV:        73 ml  RIGHT VENTRICLE RV S prime:     13.30 cm/s TAPSE (M-mode): 2.6 cm  LEFT ATRIUM             Index        RIGHT ATRIUM           Index LA diam:        3.30 cm 2.17 cm/m   RA Area:     16.00 cm LA Vol (A2C):   84.7 ml 55.66 ml/m  RA Volume:   44.10 ml  28.98 ml/m LA Vol (A4C):   44.4 ml 29.18 ml/m LA Biplane Vol: 62.4 ml 41.01 ml/m AORTIC VALVE                     PULMONIC VALVE AV Area (Vmax):    1.70 cm      PV Vmax:          1.31 m/s AV Area (Vmean):   1.61 cm      PV Peak grad:     6.8 mmHg AV Area (VTI):     1.66 cm      PR End Diast Vel: 2.24 msec AV Vmax:           173.50 cm/s AV Vmean:          118.500 cm/s AV VTI:            0.392 m AV Peak Grad:      12.0 mmHg AV Mean Grad:      6.5 mmHg LVOT Vmax:         147.00 cm/s LVOT Vmean:        94.800 cm/s LVOT VTI:          0.323 m LVOT/AV VTI ratio: 0.82  AORTA Ao Root diam: 2.70 cm Ao Asc diam:  2.80 cm Ao Arch diam: 2.6 cm  MITRAL VALVE                TRICUSPID VALVE MV Area (PHT): 2.62 cm     TR Peak grad:  31.8 mmHg MV Decel Time: 289 msec     TR Vmax:        282.00 cm/s MV E velocity: 90.70 cm/s MV A velocity: 120.00 cm/s  SHUNTS MV E/A ratio:  0.76         Systemic VTI:  0.32 m Systemic Diam: 1.60 cm  Stanly Leavens MD Electronically signed by Stanly Leavens MD Signature Date/Time: 11/07/2022/2:51:25 PM    Final    MONITORS  LONG TERM MONITOR (3-14 DAYS) 02/19/2023  Narrative Patch Wear Time:  3 days and 0 hours (2024-11-23T11:28:36-0500 to 2024-11-26T12:10:21-0500)  Patient had a min HR of 37 bpm, max HR of 133 bpm, and avg HR of 53 bpm. Predominant underlying rhythm was Sinus Rhythm. Bundle Branch Block/IVCD was present. 1 run of Supraventricular Tachycardia occurred lasting 5 beats with a max rate of 133 bpm (avg 108 bpm). Isolated SVEs were frequent (7.5%, 14869), and no SVE Couplets or SVE Triplets were  present. Isolated VEs were rare (<1.0%), and no VE Couplets or VE Triplets were present.  SR/SB/ST Rare PVCs/ Occasional PACs9 7.5% burden) One short run of SVT     CARDIAC MRI  MR CARDIAC MORPHOLOGY W WO CONTRAST 04/29/2023  Narrative CLINICAL DATA:  Clinical question of left ventricular hypertrophy Study assumes BSA of 1.49 m2.  EXAM: CARDIAC MRI  TECHNIQUE: The patient was scanned on a 1.5 Tesla GE magnet. A dedicated cardiac coil was used. Functional imaging was done using Fiesta sequences. 2,3, and 4 chamber views were done to assess for RWMA's. Modified Simpson's rule using a short axis stack was used to calculate an ejection fraction on a dedicated work [employer]. The patient received 8 cc of Gadavist . After 10 minutes inversion recovery sequences were used to assess for infiltration and scar tissue. Flow quantification was performed 2 times during this examination with flow quantification performed at the levels of the ascending aorta above the valve, pulmonary artery above the valve.  CONTRAST:  8 cc  of Gadavist   FINDINGS: 1. Normal left ventricular size, with LVEDD 38 mm, and LVEDVi 68 mL/m2.  Mild increase in basal septal hypertrophy (12 mm) with myocardial mass index of 59 g/m2.  Normal left ventricular systolic function (LVEF =70%). There are no regional wall motion abnormalities.  Left ventricular parametric mapping notable for normal ECV and T2.  There is no late gadolinium enhancement in the left ventricular myocardium.  2.  Dilated right ventricular size with RVEDVI 108 mL/m2.  Normal right ventricular thickness.  Normal right ventricular systolic function (RVEF =60%). There are no regional wall motion abnormalities or aneurysms.  3.  Moderate right atrial enlargement.  Normal left atrial size.  4. Normal size of the aortic root, ascending aorta and pulmonary artery.  5. Valve assessment:  Aortic Valve: Tri-leaflet  valve. No significant regurgitation. Regurgitant fraction 3%.  Pulmonic Valve: No significant regurgitation. Regurgitant fraction < 1%.  Tricuspid Valve: No significant regurgitation. Regurgitant fraction 3%.  Mitral Valve: Anterior leaflet thickening. No significant regurgitation. Regurgitant fraction 4%.  6. Normal pericardium. Small pericardial effusion around the ventricular base. No evidence of increased pericardial pressures.  7. Grossly, no extracardiac findings. Recommended dedicated study if concerned for non-cardiac pathology.  8. Breath-hold Artifacts noted.  IMPRESSION: 1. Study does not meet the diagnostic criteria for hypertrophic cardiomyopathy. No evidence of infiltrative disease.  2.  Right atrial and right ventricular dilation.  3.  No significant valve regurgitation.  Stanly Leavens MD   Electronically Signed By: Stanly  Santo M.D. On: 04/29/2023 21:30   ______________________________________________________________________________________________     Recent Labs: 02/04/2023: ALT 13; BUN 48; Creatinine 1.40; Potassium 3.7; Sodium 139 03/17/2023: Hemoglobin 10.1; Platelets 160  Recent Lipid Panel    Component Value Date/Time   CHOL 152 03/05/2022 0901   TRIG 49 03/05/2022 0901   HDL 81 03/05/2022 0901   CHOLHDL 1.9 03/05/2022 0901   LDLCALC 60 03/05/2022 0901    History of Present Illness    73 year old female with with the above past medical history including CAD s/p DES x 2-mLAD in 2010 in [place], carotid artery disease, hypertension, hyperlipidemia, and type 2 diabetes.    She has a history of CAD s/p DES x 2-LAD in February 2010 in [place].  Myoview in 2019 was nonischemic.  Echocardiogram May 2022 showed EF 65 to 70%, normal LV function, no RWMA, G1 DD, normal RV, no significant valvular abnormalities. ABIs in 04/2022 were essentially normal.  Cardiac PET stress test  in 10/2022 was low risk.  Repeat echocardiogram in 10/2022  showed EF 70 to 75%, hyperdynamic LV function, no RWMA, moderate asymmetric LVH, G1 DD, hyperdynamic RV, no significant valvular abnormalities no significant change from prior study.  Carotid ultrasound in 11/2022 revealed 1 to 39% R ICA stenosis, 40 to 59% LICA stenosis.  Repeat study was recommended in 1 year.  Cardiac monitor in 02/2023 revealed predominantly normal sinus rhythm, BBB/IVCD, 1 run of SVT lasting 5 beats, occasional PACs, rare PVCs, no significant arrhythmia.  MRI in 04/2023 was negative for HCM, there was no evidence of infiltrative disease.  She was last seen in the office on 05/04/2023 and reported ongoing dyspnea on exertion.  It was noted that her symptoms were likely noncardiac given overall reassuring cardiac workup.   She presents today for follow-up.  Since her last visit she has been stable from a cardiac standpoint.  She continues to note intermittent bradycardia, she is asymptomatic with this.  BP has been stable. She has stable mild dyspnea on exertion, unchanged from prior visits.  Overall, she reports feeling well.   Home Medications    Current Outpatient Medications  Medication Sig Dispense Refill   albuterol  (VENTOLIN  HFA) 108 (90 Base) MCG/ACT inhaler Inhale 2 puffs into the lungs every 6 (six) hours as needed for wheezing or shortness of breath. 8 g 6   aspirin EC 81 MG tablet Take 81 mg by mouth daily.     atorvastatin  (LIPITOR) 80 MG tablet Take 1 tablet by mouth once daily 90 tablet 3   Carboxymethylcellulose Sodium (EYE DROPS OP) Apply to eye. Refresh Drops as needed before bed     Carboxymethylcellulose Sodium (THERATEARS OP) Apply to eye in the morning.     Cholecalciferol (VITAMIN D-3 PO) Take 1 Capful by mouth daily.     citalopram (CELEXA) 20 MG tablet Take 20 mg by mouth daily.     CRANBERRY PO Take 4,200 capsules by mouth daily.     enalapril  (VASOTEC ) 5 MG tablet Take 1 tablet by mouth once daily 90 tablet 3   Ferrous Sulfate (IRON) 325 (65 Fe) MG TABS  Take 1 tablet by mouth daily.     gabapentin (NEURONTIN) 300 MG capsule Take 300 mg by mouth at bedtime.      Lactobacillus (PROBIOTIC ACIDOPHILUS PO) Take by mouth.     lubiprostone (AMITIZA) 24 MCG capsule Take 24 mcg by mouth 2 (two) times daily with a meal.     NIFEdipine  (PROCARDIA  XL/NIFEDICAL XL) 60 MG  24 hr tablet Take 1 tablet (60 mg total) by mouth daily. 90 tablet 3   oxybutynin (DITROPAN-XL) 5 MG 24 hr tablet Take 5 mg by mouth at bedtime.     PREVIDENT 5000 DRY MOUTH 1.1 % GEL dental gel Place 1 Application onto teeth daily.     repaglinide (PRANDIN) 0.5 MG tablet      vitamin C (ASCORBIC ACID) 500 MG tablet Take 500 mg by mouth daily.     No current facility-administered medications for this visit.     Review of Systems    She denies chest pain, palpitations, pnd, orthopnea, n, v, dizziness, syncope, edema, weight gain, or early satiety. All other systems reviewed and are otherwise negative except as noted above.   Physical Exam    VS:  BP 132/66 (BP Location: Left Arm, Patient Position: Sitting, Cuff Size: Normal)   Pulse (!) 48   Ht 4' 11 (1.499 m)   Wt 112 lb 3.2 oz (50.9 kg)   SpO2 98%   BMI 22.66 kg/m  GEN: Well nourished, well developed, in no acute distress. HEENT: normal. Neck: Supple, no JVD, carotid bruits, or masses. Cardiac: RRR, no murmurs, rubs, or gallops. No clubbing, cyanosis, edema.  Radials/DP/PT 2+ and equal bilaterally.  Respiratory:  Respirations regular and unlabored, clear to auscultation bilaterally. GI: Soft, nontender, nondistended, BS + x 4. MS: no deformity or atrophy. Skin: warm and dry, no rash. Neuro:  Strength and sensation are intact. Psych: Normal affect.  Accessory Clinical Findings    ECG personally reviewed by me today - EKG Interpretation Date/Time:  Tuesday November 10 2023 10:37:31 EDT Ventricular Rate:  48 PR Interval:  154 QRS Duration:  138 QT Interval:  464 QTC Calculation: 414 R Axis:   83  Text  Interpretation: Sinus bradycardia Right bundle branch block When compared with ECG of 04-Feb-2023 12:46, Premature atrial complexes are no longer Present Confirmed by Daneen Perkins ([id number]) on 11/10/2023 10:51:54 AM  - no acute changes.   Lab Results  Component Value Date   WBC 6.1 03/17/2023   HGB 10.1 (L) 03/17/2023   HCT 30.8 (L) 03/17/2023   MCV 91 03/17/2023   PLT 160 03/17/2023   Lab Results  Component Value Date   CREATININE 1.40 (H) 02/04/2023   BUN 48 (H) 02/04/2023   NA 139 02/04/2023   K 3.7 02/04/2023   CL 103 02/04/2023   CO2 29 02/04/2023   Lab Results  Component Value Date   ALT 13 02/04/2023   AST 14 (L) 02/04/2023   ALKPHOS 49 02/04/2023   BILITOT 0.5 02/04/2023   Lab Results  Component Value Date   CHOL 152 03/05/2022   HDL 81 03/05/2022   LDLCALC 60 03/05/2022   TRIG 49 03/05/2022   CHOLHDL 1.9 03/05/2022    No results found for: HGBA1C  Assessment & Plan    1. CAD/dyspnea on exertion/fatigue: S/p DES x 2-LAD in February 2010.  Myoview in 2019 was nonischemic. Echocardiogram May 2022 showed EF 65 to 70%, normal LV function, no RWMA, G1 DD, normal RV, no significant valvular abnormalities. Cardiac PET stress test in 10/2022 was negative for ischemia. Repeat echocardiogram in 10/2022 showed EF 70 to 75%, hyperdynamic LV function, no RWMA, moderate asymmetric LVH, G1 DD, hyperdynamic RV, no significant valvular abnormalities no significant change from prior study. MRI in 04/2023 was negative for HCM, there was no evidence of infiltrative disease. She continues to note mild dyspnea on exertion, unchanged from prior visits.  Given  extensive reassuring cardiac workup, consider noncardiac cause of symptoms.  Continue aspirin, enalapril , nifedipine , and Lipitor.   2. Bradycardia: Cardiac monitor in 02/2023 revealed predominantly normal sinus rhythm, BBB/IVCD, 1 run of SVT lasting 5 beats, occasional PACs, rare PVCs, no significant arrhythmia.  She continues to note  intermittent bradycardia, generally asymptomatic.  Continue to monitor.  She is on any AV nodal blocking agents.  3. LVH: MRI in 04/2023 was negative for HCM, there was no evidence of infiltrative disease. Euvolemic and well compensated on exam.   4. Carotid artery disease: Carotid ultrasound in 12/05/2022 revealed 1 to 39% R ICA stenosis, 40 to 59% LICA stenosis.  Repeat carotid ultrasound scheduled for 11/17/2023.  Asymptomatic.  Continue aspirin, Lipitor.   5. Hypertension: BP well controlled. Continue current antihypertensive regimen.    6. Hyperlipidemia: LDL was 49 in 09/2023.  Continue Lipitor.  7. Type 2 diabetes: A1c was 6.5 in 09/2023. Monitored and manged per PCP.    8.  Anemia: Hemoglobin was 10.1 in 02/2023.  She has a history of anemia, previously followed by hematology.  Recommend continued follow-up with PCP.    9. Disposition: Follow-up in 3 to 4 months with Dr. Court.        Damien JAYSON Braver, NP 11/10/2023, 11:06 AM

## 2023-11-10 NOTE — Patient Instructions (Signed)
 Medication Instructions:  Your physician recommends that you continue on your current medications as directed. Please refer to the Current Medication list given to you today.  *If you need a refill on your cardiac medications before your next appointment, please call your pharmacy*  Lab Work: NONE ordered at this time of appointment   Testing/Procedures: NONE ordered at this time of appointment    Follow-Up: At [facility], you and your health needs are our priority.  As part of our continuing mission to provide you with exceptional heart care, our providers are all part of one team.  This team includes your primary Cardiologist (physician) and Advanced Practice Providers or APPs (Physician Assistants and Nurse Practitioners) who all work together to provide you with the care you need, when you need it.  Your next appointment:   3-4 month(s)  Provider:   Lauro Portal, MD    We recommend signing up for the patient portal called "MyChart".  Sign up information is provided on this After Visit Summary.  MyChart is used to connect with patients for Virtual Visits (Telemedicine).  Patients are able to view lab/test results, encounter notes, upcoming appointments, etc.  Non-urgent messages can be sent to your provider as well.   To learn more about what you can do with MyChart, go to [email or web address].

## 2023-11-11 DIAGNOSIS — Z0000 Encounter for general adult medical examination without abnormal findings: Secondary | ICD-10-CM | POA: Diagnosis not present

## 2023-11-11 DIAGNOSIS — Z23 Encounter for immunization: Secondary | ICD-10-CM | POA: Diagnosis not present

## 2023-11-12 ENCOUNTER — Encounter: Payer: Self-pay | Admitting: Nurse Practitioner

## 2023-11-13 DIAGNOSIS — N1832 Chronic kidney disease, stage 3b: Secondary | ICD-10-CM | POA: Diagnosis not present

## 2023-11-13 DIAGNOSIS — N189 Chronic kidney disease, unspecified: Secondary | ICD-10-CM | POA: Diagnosis not present

## 2023-11-15 DIAGNOSIS — I10 Essential (primary) hypertension: Secondary | ICD-10-CM | POA: Diagnosis not present

## 2023-11-15 DIAGNOSIS — E663 Overweight: Secondary | ICD-10-CM | POA: Diagnosis not present

## 2023-11-15 DIAGNOSIS — F3341 Major depressive disorder, recurrent, in partial remission: Secondary | ICD-10-CM | POA: Diagnosis not present

## 2023-11-15 DIAGNOSIS — I2510 Atherosclerotic heart disease of native coronary artery without angina pectoris: Secondary | ICD-10-CM | POA: Diagnosis not present

## 2023-11-15 DIAGNOSIS — N1832 Chronic kidney disease, stage 3b: Secondary | ICD-10-CM | POA: Diagnosis not present

## 2023-11-15 DIAGNOSIS — I70209 Unspecified atherosclerosis of native arteries of extremities, unspecified extremity: Secondary | ICD-10-CM | POA: Diagnosis not present

## 2023-11-16 DIAGNOSIS — I70209 Unspecified atherosclerosis of native arteries of extremities, unspecified extremity: Secondary | ICD-10-CM | POA: Diagnosis not present

## 2023-11-16 DIAGNOSIS — I10 Essential (primary) hypertension: Secondary | ICD-10-CM | POA: Diagnosis not present

## 2023-11-16 DIAGNOSIS — I2510 Atherosclerotic heart disease of native coronary artery without angina pectoris: Secondary | ICD-10-CM | POA: Diagnosis not present

## 2023-11-16 DIAGNOSIS — N1832 Chronic kidney disease, stage 3b: Secondary | ICD-10-CM | POA: Diagnosis not present

## 2023-11-17 ENCOUNTER — Ambulatory Visit: Payer: Self-pay | Admitting: Cardiovascular Disease

## 2023-11-17 ENCOUNTER — Ambulatory Visit (HOSPITAL_COMMUNITY)
Admission: RE | Admit: 2023-11-17 | Discharge: 2023-11-17 | Disposition: A | Discharge status: Home / Self Care | Payer: PPO | Source: Ambulatory Visit | Attending: Cardiovascular Disease | Admitting: Cardiovascular Disease

## 2023-11-17 DIAGNOSIS — I6522 Occlusion and stenosis of left carotid artery: Secondary | ICD-10-CM

## 2023-11-20 DIAGNOSIS — I2510 Atherosclerotic heart disease of native coronary artery without angina pectoris: Secondary | ICD-10-CM | POA: Diagnosis not present

## 2023-11-20 DIAGNOSIS — D509 Iron deficiency anemia, unspecified: Secondary | ICD-10-CM | POA: Diagnosis not present

## 2023-11-20 DIAGNOSIS — I129 Hypertensive chronic kidney disease with stage 1 through stage 4 chronic kidney disease, or unspecified chronic kidney disease: Secondary | ICD-10-CM | POA: Diagnosis not present

## 2023-11-20 DIAGNOSIS — E1122 Type 2 diabetes mellitus with diabetic chronic kidney disease: Secondary | ICD-10-CM | POA: Diagnosis not present

## 2023-11-20 DIAGNOSIS — N1832 Chronic kidney disease, stage 3b: Secondary | ICD-10-CM | POA: Diagnosis not present

## 2023-12-15 DIAGNOSIS — I2510 Atherosclerotic heart disease of native coronary artery without angina pectoris: Secondary | ICD-10-CM | POA: Diagnosis not present

## 2023-12-15 DIAGNOSIS — N1832 Chronic kidney disease, stage 3b: Secondary | ICD-10-CM | POA: Diagnosis not present

## 2023-12-15 DIAGNOSIS — I70209 Unspecified atherosclerosis of native arteries of extremities, unspecified extremity: Secondary | ICD-10-CM | POA: Diagnosis not present

## 2023-12-15 DIAGNOSIS — F3341 Major depressive disorder, recurrent, in partial remission: Secondary | ICD-10-CM | POA: Diagnosis not present

## 2023-12-15 DIAGNOSIS — I10 Essential (primary) hypertension: Secondary | ICD-10-CM | POA: Diagnosis not present

## 2023-12-15 DIAGNOSIS — E663 Overweight: Secondary | ICD-10-CM | POA: Diagnosis not present

## 2023-12-16 ENCOUNTER — Other Ambulatory Visit: Payer: Self-pay | Admitting: Cardiovascular Disease

## 2023-12-16 DIAGNOSIS — E782 Mixed hyperlipidemia: Secondary | ICD-10-CM

## 2023-12-16 DIAGNOSIS — N1832 Chronic kidney disease, stage 3b: Secondary | ICD-10-CM | POA: Diagnosis not present

## 2023-12-16 DIAGNOSIS — I70209 Unspecified atherosclerosis of native arteries of extremities, unspecified extremity: Secondary | ICD-10-CM | POA: Diagnosis not present

## 2023-12-16 DIAGNOSIS — I2510 Atherosclerotic heart disease of native coronary artery without angina pectoris: Secondary | ICD-10-CM | POA: Diagnosis not present

## 2023-12-16 DIAGNOSIS — I10 Essential (primary) hypertension: Secondary | ICD-10-CM | POA: Diagnosis not present

## 2024-01-15 DIAGNOSIS — N1832 Chronic kidney disease, stage 3b: Secondary | ICD-10-CM | POA: Diagnosis not present

## 2024-01-15 DIAGNOSIS — F3341 Major depressive disorder, recurrent, in partial remission: Secondary | ICD-10-CM | POA: Diagnosis not present

## 2024-01-15 DIAGNOSIS — I70209 Unspecified atherosclerosis of native arteries of extremities, unspecified extremity: Secondary | ICD-10-CM | POA: Diagnosis not present

## 2024-01-15 DIAGNOSIS — I10 Essential (primary) hypertension: Secondary | ICD-10-CM | POA: Diagnosis not present

## 2024-01-15 DIAGNOSIS — E663 Overweight: Secondary | ICD-10-CM | POA: Diagnosis not present

## 2024-01-15 DIAGNOSIS — I2510 Atherosclerotic heart disease of native coronary artery without angina pectoris: Secondary | ICD-10-CM | POA: Diagnosis not present

## 2024-01-20 ENCOUNTER — Other Ambulatory Visit: Payer: Self-pay | Admitting: Family Medicine

## 2024-01-20 DIAGNOSIS — Z1231 Encounter for screening mammogram for malignant neoplasm of breast: Secondary | ICD-10-CM

## 2024-01-22 DIAGNOSIS — Z961 Presence of intraocular lens: Secondary | ICD-10-CM | POA: Diagnosis not present

## 2024-01-22 DIAGNOSIS — H353132 Nonexudative age-related macular degeneration, bilateral, intermediate dry stage: Secondary | ICD-10-CM | POA: Diagnosis not present

## 2024-01-22 DIAGNOSIS — E113291 Type 2 diabetes mellitus with mild nonproliferative diabetic retinopathy without macular edema, right eye: Secondary | ICD-10-CM | POA: Diagnosis not present

## 2024-01-22 DIAGNOSIS — H442E1 Degenerative myopia with other maculopathy, right eye: Secondary | ICD-10-CM | POA: Diagnosis not present

## 2024-01-22 DIAGNOSIS — H35033 Hypertensive retinopathy, bilateral: Secondary | ICD-10-CM | POA: Diagnosis not present

## 2024-02-03 ENCOUNTER — Ambulatory Visit: Attending: Cardiovascular Disease | Admitting: Cardiovascular Disease

## 2024-02-03 ENCOUNTER — Encounter: Payer: Self-pay | Admitting: Cardiovascular Disease

## 2024-02-03 VITALS — BP 129/64 | HR 56 | Ht 59.0 in | Wt 109.4 lb

## 2024-02-03 DIAGNOSIS — I421 Obstructive hypertrophic cardiomyopathy: Secondary | ICD-10-CM

## 2024-02-03 DIAGNOSIS — I2510 Atherosclerotic heart disease of native coronary artery without angina pectoris: Secondary | ICD-10-CM

## 2024-02-03 DIAGNOSIS — I6522 Occlusion and stenosis of left carotid artery: Secondary | ICD-10-CM | POA: Diagnosis not present

## 2024-02-03 DIAGNOSIS — I10 Essential (primary) hypertension: Secondary | ICD-10-CM

## 2024-02-03 DIAGNOSIS — E782 Mixed hyperlipidemia: Secondary | ICD-10-CM

## 2024-02-03 DIAGNOSIS — R0609 Other forms of dyspnea: Secondary | ICD-10-CM | POA: Diagnosis not present

## 2024-02-03 MED ORDER — ENALAPRIL MALEATE 5 MG PO TABS
5.0000 mg | ORAL_TABLET | Freq: Every day | ORAL | 3 refills | Status: AC
Start: 2024-02-03 — End: ?

## 2024-02-03 NOTE — Assessment & Plan Note (Signed)
 History of hyperlipidemia on statin therapy with lipid profile performed 10/15/2023 revealing total cholesterol 110, LDL 49 and HDL 40.

## 2024-02-03 NOTE — Assessment & Plan Note (Signed)
 History of asymmetric septal hypertrophy with a low outflow tract gradient and a negative cardiac MRI followed by Dr.Chandrasekher.

## 2024-02-03 NOTE — Assessment & Plan Note (Signed)
 History of moderate left ICA stenosis by duplex ultrasound performed 11/17/2023.  This to be repeated on an annual basis.

## 2024-02-03 NOTE — Patient Instructions (Signed)
 Medication Instructions:  Your physician recommends that you continue on your current medications as directed. Please refer to the Current Medication list given to you today.  *If you need a refill on your cardiac medications before your next appointment, please call your pharmacy*   Follow-Up: At [facility], you and your health needs are our priority.  As part of our continuing mission to provide you with exceptional heart care, our providers are all part of one team.  This team includes your primary Cardiologist (physician) and Advanced Practice Providers or APPs (Physician Assistants and Nurse Practitioners) who all work together to provide you with the care you need, when you need it.  Your next appointment:   6 month(s)  Provider:   Damien Braver, NP         Then, Dorn Lesches, MD will plan to see you again in 12 month(s).

## 2024-02-03 NOTE — Assessment & Plan Note (Signed)
 History of dyspnea on exertion with a negative cardiac workup on albuterol  after evaluation by a pulmonologist.

## 2024-02-03 NOTE — Progress Notes (Signed)
 02/03/2024 Kimberly Rivers   1950-07-30  [id number]  Primary Physician Katina Pfeiffer, PA-C Primary Cardiologist: Dorn JINNY Lesches MD [facility] [place] [place], [place]  HPI:  Kimberly Rivers is a 73 y.o.  mildly overweight single Caucasian female with no children who relocated from the [place] city to [place]  because of retirement and cost of living.  She was referred by Dr. Laurent, her PCP, to be established in my practice for ongoing cardiovascular care.  I last saw her in the office 05/04/2023. 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  Risk factors include treated hypertension, diabetes and lipidemia.  Her mother did have a CABG.  She is never had a heart attack or stroke.  She had stenting at [facility] before 2010 with a Taxus express drug-eluting stent and then stenting 2/29/2010 with 2 Endeavor stents in her LAD.  She had a normal 2D echo and Myoview stress test by her cardiologist in [place]  [facility] 08/26/2017.    I referred her to Dr.Chandrasekhar to evaluate hypertrophic obstructive cardiomyopathy.  She did have a cardiac MRI performed 04/29/2023 that did not support this diagnosis.  2D echocardiogram performed 11/07/2022 showed normal LV systolic function (hyperdynamic) with a fairly low outflow tract gradient and moderate asymmetric septal hypertrophy.  A cardiac PET study performed 10/28/2022 was low risk and nonischemic.  Her major complaints have been dyspnea on exertion.  She has been evaluated by pulmonologist as well.  Since I saw her 9 months ago she is remained stable.  She does complain of some asymmetry in her lower extremities left larger than right although I cannot really appreciate this and there is no significant edema.  She continues to have dyspnea exertion but denies chest pain.   Current Meds  Medication Sig   albuterol  (VENTOLIN  HFA) 108 (90 Base) MCG/ACT inhaler Inhale 2 puffs into the lungs every 6 (six) hours as needed for wheezing or  shortness of breath.   aspirin EC 81 MG tablet Take 81 mg by mouth daily.   atorvastatin  (LIPITOR) 80 MG tablet Take 1 tablet by mouth once daily   Carboxymethylcellulose Sodium (EYE DROPS OP) Apply to eye. Refresh Drops as needed before bed   Carboxymethylcellulose Sodium (THERATEARS OP) Apply to eye in the morning.   Cholecalciferol (VITAMIN D-3 PO) Take 1 Capful by mouth daily.   citalopram (CELEXA) 20 MG tablet Take 20 mg by mouth daily.   CRANBERRY PO Take 4,200 capsules by mouth daily.   Ferrous Sulfate (IRON) 325 (65 Fe) MG TABS Take 1 tablet by mouth daily.   gabapentin (NEURONTIN) 300 MG capsule Take 300 mg by mouth at bedtime.    Lactobacillus (PROBIOTIC ACIDOPHILUS PO) Take by mouth.   lubiprostone (AMITIZA) 24 MCG capsule Take 24 mcg by mouth 2 (two) times daily with a meal.   NIFEdipine  (PROCARDIA  XL/NIFEDICAL XL) 60 MG 24 hr tablet Take 1 tablet (60 mg total) by mouth daily.   oxybutynin (DITROPAN-XL) 5 MG 24 hr tablet Take 5 mg by mouth at bedtime.   PREVIDENT 5000 DRY MOUTH 1.1 % GEL dental gel Place 1 Application onto teeth daily.   repaglinide (PRANDIN) 0.5 MG tablet    vitamin C (ASCORBIC ACID) 500 MG tablet Take 500 mg by mouth daily.   [DISCONTINUED] enalapril  (VASOTEC ) 5 MG tablet Take 1 tablet by mouth once daily     Allergies  Allergen Reactions   Clindamycin/Lincomycin    Doxycycline    Penicillins  Social History   Socioeconomic History   Marital status: Single    Spouse name: Not on file   Number of children: Not on file   Years of education: Not on file   Highest education level: Not on file  Occupational History   Not on file  Tobacco Use   Smoking status: Never   Smokeless tobacco: Never  Vaping Use   Vaping status: Never Used  Substance and Sexual Activity   Alcohol use: Not Currently   Drug use: Never   Sexual activity: Not Currently  Other Topics Concern   Not on file  Social History Narrative   Not on file   Social Drivers of  Health   Financial Resource Strain: Not on file  Food Insecurity: Not on file  Transportation Needs: Not on file  Physical Activity: Not on file  Stress: Not on file  Social Connections: Not on file  Intimate Partner Violence: Not on file     Review of Systems: General: negative for chills, fever, night sweats or weight changes.  Cardiovascular: negative for chest pain, dyspnea on exertion, edema, orthopnea, palpitations, paroxysmal nocturnal dyspnea or shortness of breath Dermatological: negative for rash Respiratory: negative for cough or wheezing Urologic: negative for hematuria Abdominal: negative for nausea, vomiting, diarrhea, bright red blood per rectum, melena, or hematemesis Neurologic: negative for visual changes, syncope, or dizziness All other systems reviewed and are otherwise negative except as noted above.    Blood pressure 129/64, pulse (!) 56, height 4' 11 (1.499 m), weight 109 lb 6.4 oz (49.6 kg), SpO2 97%.  General appearance: alert and no distress Neck: no adenopathy, no carotid bruit, no JVD, supple, symmetrical, trachea midline, and thyroid not enlarged, symmetric, no tenderness/mass/nodules Lungs: clear to auscultation bilaterally Heart: regular rate and rhythm, S1, S2 normal, no murmur, click, rub or gallop Extremities: extremities normal, atraumatic, no cyanosis or edema Pulses: 2+ and symmetric Skin: Skin color, texture, turgor normal. No rashes or lesions Neurologic: Grossly normal  EKG not performed today      ASSESSMENT AND PLAN:   Essential hypertension History of of essential hypertension her blood pressure measured today at 129/64.  She is on nifedipine  and enalapril .  Hyperlipidemia History of hyperlipidemia on statin therapy with lipid profile performed 10/15/2023 revealing total cholesterol 110, LDL 49 and HDL 40.  Coronary artery disease History of CAD status post stenting at [facility] in 2010 with a Taxus drug-eluting stent  and stenting 2/29/2010 with 2 Endeavor stents to her LAD.  She did have a cardiac PET study performed 10/28/2022 which was low risk and nonischemic.  She denies chest pain.  Carotid artery disease History of moderate left ICA stenosis by duplex ultrasound performed 11/17/2023.  This to be repeated on an annual basis.  Obstructive hypertrophic cardiomyopathy (HCC) History of asymmetric septal hypertrophy with a low outflow tract gradient and a negative cardiac MRI followed by Dr.Chandrasekher.   Dyspnea on exertion History of dyspnea on exertion with a negative cardiac workup on albuterol  after evaluation by a pulmonologist.     Dorn DOROTHA Lesches MD [facility], [facility] 02/03/2024 10:43 AM

## 2024-02-03 NOTE — Assessment & Plan Note (Signed)
 History of of essential hypertension her blood pressure measured today at 129/64.  She is on nifedipine  and enalapril .

## 2024-02-03 NOTE — Assessment & Plan Note (Signed)
 History of CAD status post stenting at [facility] in 2010 with a Taxus drug-eluting stent and stenting 2/29/2010 with 2 Endeavor stents to her LAD.  She did have a cardiac PET study performed 10/28/2022 which was low risk and nonischemic.  She denies chest pain.

## 2024-02-14 DIAGNOSIS — I2510 Atherosclerotic heart disease of native coronary artery without angina pectoris: Secondary | ICD-10-CM | POA: Diagnosis not present

## 2024-02-14 DIAGNOSIS — I70209 Unspecified atherosclerosis of native arteries of extremities, unspecified extremity: Secondary | ICD-10-CM | POA: Diagnosis not present

## 2024-02-14 DIAGNOSIS — I10 Essential (primary) hypertension: Secondary | ICD-10-CM | POA: Diagnosis not present

## 2024-02-14 DIAGNOSIS — E663 Overweight: Secondary | ICD-10-CM | POA: Diagnosis not present

## 2024-02-14 DIAGNOSIS — F3341 Major depressive disorder, recurrent, in partial remission: Secondary | ICD-10-CM | POA: Diagnosis not present

## 2024-02-14 DIAGNOSIS — N1832 Chronic kidney disease, stage 3b: Secondary | ICD-10-CM | POA: Diagnosis not present

## 2024-02-18 NOTE — Telephone Encounter (Signed)
 NFN

## 2024-02-21 DIAGNOSIS — R399 Unspecified symptoms and signs involving the genitourinary system: Secondary | ICD-10-CM | POA: Diagnosis not present

## 2024-02-21 DIAGNOSIS — N949 Unspecified condition associated with female genital organs and menstrual cycle: Secondary | ICD-10-CM | POA: Diagnosis not present

## 2024-03-08 ENCOUNTER — Emergency Department (HOSPITAL_COMMUNITY)

## 2024-03-08 ENCOUNTER — Encounter (HOSPITAL_COMMUNITY): Payer: Self-pay | Admitting: Emergency Medicine

## 2024-03-08 ENCOUNTER — Other Ambulatory Visit: Payer: Self-pay

## 2024-03-08 ENCOUNTER — Ambulatory Visit
Admission: RE | Admit: 2024-03-08 | Discharge: 2024-03-08 | Disposition: A | Discharge status: Home / Self Care | Source: Ambulatory Visit | Attending: Family Medicine | Admitting: Family Medicine

## 2024-03-08 ENCOUNTER — Emergency Department (HOSPITAL_COMMUNITY)
Admission: EM | Admit: 2024-03-08 | Discharge: 2024-03-08 | Disposition: A | Discharge status: Home / Self Care | Attending: Emergency Medicine | Admitting: Emergency Medicine

## 2024-03-08 DIAGNOSIS — I2510 Atherosclerotic heart disease of native coronary artery without angina pectoris: Secondary | ICD-10-CM | POA: Insufficient documentation

## 2024-03-08 DIAGNOSIS — E1122 Type 2 diabetes mellitus with diabetic chronic kidney disease: Secondary | ICD-10-CM | POA: Diagnosis not present

## 2024-03-08 DIAGNOSIS — Z7982 Long term (current) use of aspirin: Secondary | ICD-10-CM | POA: Diagnosis not present

## 2024-03-08 DIAGNOSIS — Z1231 Encounter for screening mammogram for malignant neoplasm of breast: Secondary | ICD-10-CM

## 2024-03-08 DIAGNOSIS — I129 Hypertensive chronic kidney disease with stage 1 through stage 4 chronic kidney disease, or unspecified chronic kidney disease: Secondary | ICD-10-CM | POA: Insufficient documentation

## 2024-03-08 DIAGNOSIS — N1831 Chronic kidney disease, stage 3a: Secondary | ICD-10-CM | POA: Diagnosis not present

## 2024-03-08 DIAGNOSIS — R0789 Other chest pain: Secondary | ICD-10-CM | POA: Diagnosis present

## 2024-03-08 DIAGNOSIS — Z79899 Other long term (current) drug therapy: Secondary | ICD-10-CM | POA: Insufficient documentation

## 2024-03-08 LAB — BASIC METABOLIC PANEL WITH GFR
Anion gap: 12 (ref 5–15)
BUN: 62 mg/dL — ABNORMAL HIGH (ref 8–23)
CO2: 21 mmol/L — ABNORMAL LOW (ref 22–32)
Calcium: 10.7 mg/dL — ABNORMAL HIGH (ref 8.9–10.3)
Chloride: 106 mmol/L (ref 98–111)
Creatinine, Ser: 1.45 mg/dL — ABNORMAL HIGH (ref 0.44–1.00)
GFR, Estimated: 37 mL/min — ABNORMAL LOW
Glucose, Bld: 134 mg/dL — ABNORMAL HIGH (ref 70–99)
Potassium: 4.3 mmol/L (ref 3.5–5.1)
Sodium: 140 mmol/L (ref 135–145)

## 2024-03-08 LAB — CBC
HCT: 26.8 % — ABNORMAL LOW (ref 36.0–46.0)
Hemoglobin: 8.8 g/dL — ABNORMAL LOW (ref 12.0–15.0)
MCH: 29.4 pg (ref 26.0–34.0)
MCHC: 32.8 g/dL (ref 30.0–36.0)
MCV: 89.6 fL (ref 80.0–100.0)
Platelets: 193 K/uL (ref 150–400)
RBC: 2.99 MIL/uL — ABNORMAL LOW (ref 3.87–5.11)
RDW: 14.2 % (ref 11.5–15.5)
WBC: 7.2 K/uL (ref 4.0–10.5)
nRBC: 0 % (ref 0.0–0.2)

## 2024-03-08 LAB — D-DIMER, QUANTITATIVE: D-Dimer, Quant: 0.46 ug{FEU}/mL (ref 0.00–0.50)

## 2024-03-08 LAB — TROPONIN T, HIGH SENSITIVITY
Troponin T High Sensitivity: 35 ng/L — ABNORMAL HIGH (ref 0–19)
Troponin T High Sensitivity: 32 ng/L — ABNORMAL HIGH (ref 0–19)

## 2024-03-08 NOTE — ED Triage Notes (Signed)
 Pt reports chest pain in rt side of chest since yesterday. Pt reports [facility] and was sent to ED due to abnormal EKG. Denies n/v, radiation or dizziness.

## 2024-03-08 NOTE — ED Provider Notes (Signed)
 " [place] EMERGENCY DEPARTMENT AT [facility] Provider Note   CSN: [id number] Arrival date & time: 03/08/24  1622     Patient presents with: Chest Pain   Kimberly Rivers is a 73 y.o. female plans of right sided chest pain since yesterday afternoon.  She describes it as a persistent dull ache in the right upper chest that does not radiate, is not exacerbated by increased physical exertion, does state that when she lies flat this improves her discomfort.  She does not have any associated shortness of breath, denies having any nausea or vomiting and states she has normal appetite.  She does have a previous history of CAD, also has stage IIIa CKD.  Further noted to have obstructive hypertrophic cardiomyopathy associated LVH.  Further review of medical history does show type 2 diabetes and primary hypertension.    Chest Pain      Prior to Admission medications  Medication Sig Start Date End Date Taking? Authorizing Provider  albuterol  (VENTOLIN  HFA) 108 (90 Base) MCG/ACT inhaler Inhale 2 puffs into the lungs every 6 (six) hours as needed for wheezing or shortness of breath. 04/01/23   Neda Hammond A, MD  aspirin EC 81 MG tablet Take 81 mg by mouth daily.    [provider]  atorvastatin  (LIPITOR) 80 MG tablet Take 1 tablet by mouth once daily 12/17/23   Berry, Javelle Donigan J, MD  Carboxymethylcellulose Sodium (EYE DROPS OP) Apply to eye. Refresh Drops as needed before bed    [provider]  Carboxymethylcellulose Sodium (THERATEARS OP) Apply to eye in the morning.    [provider]  Cholecalciferol (VITAMIN D-3 PO) Take 1 Capful by mouth daily.    [provider]  citalopram (CELEXA) 20 MG tablet Take 20 mg by mouth daily.    [provider]  CRANBERRY PO Take 4,200 capsules by mouth daily.    [provider]  enalapril  (VASOTEC ) 5 MG tablet Take 1 tablet (5 mg total) by mouth daily. 02/03/24   Court Dorn PARAS, MD   Ferrous Sulfate (IRON) 325 (65 Fe) MG TABS Take 1 tablet by mouth daily.    [provider]  gabapentin (NEURONTIN) 300 MG capsule Take 300 mg by mouth at bedtime.     [provider]  Lactobacillus (PROBIOTIC ACIDOPHILUS PO) Take by mouth.    [provider]  lubiprostone (AMITIZA) 24 MCG capsule Take 24 mcg by mouth 2 (two) times daily with a meal.    [provider]  NIFEdipine  (PROCARDIA  XL/NIFEDICAL XL) 60 MG 24 hr tablet Take 1 tablet (60 mg total) by mouth daily. 10/12/19   Court Dorn PARAS, MD  oxybutynin (DITROPAN-XL) 5 MG 24 hr tablet Take 5 mg by mouth at bedtime.    [provider]  PREVIDENT 5000 DRY MOUTH 1.1 % GEL dental gel Place 1 Application onto teeth daily. 02/10/23   [provider]  repaglinide (PRANDIN) 0.5 MG tablet  06/04/19   [provider]  vitamin C (ASCORBIC ACID) 500 MG tablet Take 500 mg by mouth daily.    [provider]    Allergies: Clindamycin/lincomycin, Doxycycline, and Penicillins    Review of Systems  Cardiovascular:  Positive for chest pain.  All other systems reviewed and are negative.   Updated Vital Signs BP 134/76 (BP Location: Right Arm)   Pulse 65   Temp 98.5 F (36.9 C) (Oral)   Resp 16   SpO2 99%   Physical Exam Vitals and  nursing note reviewed.  Constitutional:      General: She is not in acute distress.    Appearance: Normal appearance.  HENT:     Head: Normocephalic and atraumatic.     Mouth/Throat:     Mouth: Mucous membranes are moist.     Pharynx: Oropharynx is clear.  Eyes:     Extraocular Movements: Extraocular movements intact.     Conjunctiva/sclera: Conjunctivae normal.     Pupils: Pupils are equal, round, and reactive to light.  Cardiovascular:     Rate and Rhythm: Normal rate and regular rhythm.     Pulses: Normal pulses.     Heart sounds: Murmur heard.     Systolic murmur is present with a grade of 2/6.     No friction rub. No gallop.      Comments: Heart rate is regular with regular rhythm, there is no appreciated systolic murmur as noted to the left upper sternal border as well as to the apex.  Patient states that she has improvement in her chest pain with forward positioning. Pulmonary:     Effort: Pulmonary effort is normal.     Breath sounds: Normal breath sounds and air entry.  Abdominal:     General: Abdomen is flat. Bowel sounds are normal.     Palpations: Abdomen is soft.     Tenderness: There is no abdominal tenderness.  Musculoskeletal:        General: Normal range of motion.     Cervical back: Normal range of motion and neck supple.     Right lower leg: No edema.     Left lower leg: No edema.  Skin:    General: Skin is warm and dry.     Capillary Refill: Capillary refill takes less than 2 seconds.  Neurological:     General: No focal deficit present.     Mental Status: She is alert and oriented to person, place, and time. Mental status is at baseline.     GCS: GCS eye subscore is 4. GCS verbal subscore is 5. GCS motor subscore is 6.  Psychiatric:        Mood and Affect: Mood normal.     (all labs ordered are listed, but only abnormal results are displayed) Labs Reviewed  BASIC METABOLIC PANEL WITH GFR - Abnormal; Notable for the following components:      Result Value   CO2 21 (*)    Glucose, Bld 134 (*)    BUN 62 (*)    Creatinine, Ser 1.45 (*)    Calcium  10.7 (*)    GFR, Estimated 37 (*)    All other components within normal limits  CBC - Abnormal; Notable for the following components:   RBC 2.99 (*)    Hemoglobin 8.8 (*)    HCT 26.8 (*)    All other components within normal limits  TROPONIN T, HIGH SENSITIVITY - Abnormal; Notable for the following components:   Troponin T High Sensitivity 35 (*)    All other components within normal limits  TROPONIN T, HIGH SENSITIVITY - Abnormal; Notable for the following components:   Troponin T High Sensitivity 32 (*)    All other components within  normal limits  D-DIMER, QUANTITATIVE    EKG: EKG Interpretation Date/Time:  Tuesday March 08 2024 19:10:15 EST Ventricular Rate:  69 PR Interval:  150 QRS Duration:  138 QT Interval:  441 QTC Calculation: 473 R Axis:   88  Text Interpretation: Sinus rhythm IVCD, consider atypical  RBBB Nonspecific T abnormalities, lateral leads since last tracing no significant change Confirmed by Lenor Hollering [phone]) on 03/08/2024 10:10:57 PM  Radiology: DG Chest 2 View Result Date: 03/08/2024 CLINICAL DATA:  Abnormal EKG. EXAM: CHEST - 2 VIEW COMPARISON:  January 14, 2023 FINDINGS: The heart size and mediastinal contours are within normal limits. Both lungs are clear. There is stable moderate severity dextroscoliosis of the thoracic spine. IMPRESSION: No active cardiopulmonary disease. Electronically Signed   By: Suzen Dials M.D.   On: 03/08/2024 18:55     Procedures   Medications Ordered in the ED - No data to display                                  Medical Decision Making Amount and/or Complexity of Data Reviewed Labs: ordered. Radiology: ordered.   Medical Decision Making:   Sharryn RISSIE SCULLEY is a 73 y.o. female who presented to the ED today with right-sided chest pain detailed above.    Additional history discussed with patient's family/caregivers.  External chart has been reviewed including previous labs and imaging as well as outpatient cardiology records. Patient's presentation is complicated by their history of left-ventricular hypertrophy and CKD.SABRA  Patient placed on continuous vitals and telemetry monitoring while in ED which was reviewed periodically.  Complete initial physical exam performed, notably the patient  was alert oriented no apparent distress.  There is a noted systolic murmur which appears to be chronic for this patient..    Reviewed and confirmed nursing documentation for past medical history, family history, social history.    Initial Assessment:    With the patient's presentation of right-sided chest discomfort, consider possible pneumonia, pneumothorax, pulmonary embolus, ACS, STEMI, pericarditis/myocarditis.   Initial Plan:   Screening labs including CBC and Metabolic panel to evaluate for infectious or metabolic etiology of disease.  Assess D-dimer secondary to rule out of possible PE. CXR to evaluate for structural/infectious intrathoracic pathology.  EKG/Troponin testing/BNP testing to evaluate for cardiac pathology. Objective evaluation as below reviewed   Initial Study Results:   Laboratory  All laboratory results reviewed without evidence of clinically relevant pathology.   Exceptions include: Troponin is elevated at initially 35, repeat troponin is 32  EKG EKG was reviewed independently. Rate, rhythm, axis, intervals all examined and without medically relevant abnormality. ST segments without concerns for elevations.    Radiology:  All images reviewed independently. Agree with radiology report at this time.   DG Chest 2 View Result Date: 03/08/2024 CLINICAL DATA:  Abnormal EKG. EXAM: CHEST - 2 VIEW COMPARISON:  January 14, 2023 FINDINGS: The heart size and mediastinal contours are within normal limits. Both lungs are clear. There is stable moderate severity dextroscoliosis of the thoracic spine. IMPRESSION: No active cardiopulmonary disease. Electronically Signed   By: Suzen Dials M.D.   On: 03/08/2024 18:55     Reassessment and Plan:   On reevaluation of the patient, she does have any change in her pain or discomfort, troponin does show initial elevation however does not show any appreciable change over 2-hour interval.  EKG is unremarkable in comparison to previous findings with sinus rhythm noted, right bundle branch block which is stable in comparison to previous EKG.  Chest x-ray does not show any acute changes.  Assess D-dimer to rule out risk for DVT, this is negative.  Given this, concerning etiology such  as acute coronary syndrome, STEMI, pneumothorax, PE, pneumonia, myocarditis/pericarditis have  been ruled out.  Given there is no concerning etiology at this time feel she is stable for discharge to outpatient follow-up for cardiology for continued evaluation of her chronic cardiac conditions as well as for this episode of chest pain.  This was thoroughly discussed with the patient which she verbalizes understanding and agreement at this time.  Given this we will discharge patient with outpatient follow-up as previously discussed.       Final diagnoses:  Chest wall pain    ED Discharge Orders     None          Myriam Dorn BROCKS, [place] 03/08/24 2232    Lenor Hollering, MD 03/08/24 2313  "

## 2024-03-11 ENCOUNTER — Telehealth: Payer: Self-pay | Admitting: Cardiovascular Disease

## 2024-03-11 NOTE — Telephone Encounter (Signed)
 Called and spoke to pt. She states, I already have an appointment with my PCP today at 1 pm.  She states she does not have any new symptoms and does not feel worse. ER precautions reviewed and pt will keep 03/21/24 appt with APP for now.   No other concerns.

## 2024-03-11 NOTE — Telephone Encounter (Signed)
" ° °  Pt c/o of Chest Pain: STAT if active CP, including tightness, pressure, jaw pain, radiating pain to shoulder/upper arm/back, CP unrelieved by Nitro. Symptoms reported of SOB, nausea, vomiting, sweating.  1. Are you having CP right now? No     2. Are you experiencing any other symptoms (ex. SOB, nausea, vomiting, sweating)? No    3. Is your CP continuous or coming and going? Coming and going    4. Have you taken Nitroglycerin? No    5. How long have you been experiencing CP? Several days     6. If NO CP at time of call then end call with telling Pt to call back or call 911 if Chest pain returns prior to return call from triage team.  Pt called to ask if there were any sooner appts before 1/12 for hospital f/u. Rescheduled pt to 1/5 with E. Monge. Pt concerned about right side chest pain she went to the ED for on 12/23. Pt requesting c/b to discuss before appt, she is trying to avoid going back to ED. Please advise.  "

## 2024-03-21 ENCOUNTER — Ambulatory Visit: Attending: Nurse Practitioner | Admitting: Nurse Practitioner

## 2024-03-21 ENCOUNTER — Encounter: Payer: Self-pay | Admitting: Nurse Practitioner

## 2024-03-21 VITALS — BP 128/80 | HR 85 | Ht 59.0 in | Wt 110.0 lb

## 2024-03-21 DIAGNOSIS — I25118 Atherosclerotic heart disease of native coronary artery with other forms of angina pectoris: Secondary | ICD-10-CM

## 2024-03-21 DIAGNOSIS — R001 Bradycardia, unspecified: Secondary | ICD-10-CM | POA: Diagnosis not present

## 2024-03-21 DIAGNOSIS — N1832 Chronic kidney disease, stage 3b: Secondary | ICD-10-CM

## 2024-03-21 DIAGNOSIS — E785 Hyperlipidemia, unspecified: Secondary | ICD-10-CM | POA: Diagnosis not present

## 2024-03-21 DIAGNOSIS — R0609 Other forms of dyspnea: Secondary | ICD-10-CM | POA: Diagnosis not present

## 2024-03-21 DIAGNOSIS — D638 Anemia in other chronic diseases classified elsewhere: Secondary | ICD-10-CM

## 2024-03-21 DIAGNOSIS — Z794 Long term (current) use of insulin: Secondary | ICD-10-CM | POA: Diagnosis not present

## 2024-03-21 DIAGNOSIS — R011 Cardiac murmur, unspecified: Secondary | ICD-10-CM

## 2024-03-21 DIAGNOSIS — I6523 Occlusion and stenosis of bilateral carotid arteries: Secondary | ICD-10-CM | POA: Diagnosis not present

## 2024-03-21 DIAGNOSIS — E118 Type 2 diabetes mellitus with unspecified complications: Secondary | ICD-10-CM

## 2024-03-21 DIAGNOSIS — I10 Essential (primary) hypertension: Secondary | ICD-10-CM | POA: Diagnosis not present

## 2024-03-21 DIAGNOSIS — I517 Cardiomegaly: Secondary | ICD-10-CM

## 2024-03-21 NOTE — Progress Notes (Unsigned)
 "  Office Visit    Patient Name: Kimberly Rivers Date of Encounter: 03/21/2024  Primary Care Provider:  Katina Pfeiffer, PA-C Primary Cardiologist:  Dorn Lesches, MD  Chief Complaint    74 year old female with a history of CAD s/p DES x 2-mLAD in 2010 in [place], carotid artery disease, hypertension, hyperlipidemia, CKD stage IIIb, and type 2 diabetes who presents for follow-up related to CAD.   Past Medical History    Past Medical History:  Diagnosis Date   Cataract    OU   Chronic kidney disease    Diabetes mellitus without complication (HCC)    Hypertension    Hypertensive retinopathy    OU   Macular degeneration    Dry OU   Past Surgical History:  Procedure Laterality Date   ANGIOPLASTY     CATARACT EXTRACTION Left    EYE SURGERY Left    Cat Sx   SPINAL FUSION     WRIST SURGERY      Allergies  Allergies[1]   Labs/Other Studies Reviewed    The following studies were reviewed today:  Cardiac Studies & Procedures   ______________________________________________________________________________________________   STRESS TESTS  NM PET CT CARDIAC PERFUSION MULTI W/ABSOLUTE BLOODFLOW 10/28/2022  Narrative   The study is normal. The study is low risk.   LV perfusion is normal. There is no evidence of ischemia. There is no evidence of infarction.   Rest left ventricular function is normal. Rest EF: 65%. Stress EF: 75%. End diastolic cavity size is normal. End systolic cavity size is normal.   Myocardial blood flow was computed to be 0.27ml/g/min at rest and 2.20ml/g/min at stress. Global myocardial blood flow reserve was 2.75 and was normal.   Coronary calcium  assessment not performed due to prior revascularization.   Electronically Signed: Jerel Balding, MD  EXAM: OVER-READ INTERPRETATION  CT CHEST  The following report is a limited chest CT over-read performed by radiologist Dr. Elsie Ko [facility] on 10/28/2022. This over-read does  not include interpretation of cardiac or coronary anatomy or pathology nor does it include evaluation of the PET data. The cardiac PET-CT interpretation by the cardiologist is attached.  COMPARISON:  None.  FINDINGS: Mediastinum/Nodes: No enlarged lymph nodes within the visualized mediastinum.Aortic atherosclerosis with possible calcifications of the aortic valve.  Lungs/Pleura: No pleural effusion or basilar pneumothorax. Tiny calcified left lower lobe granuloma and mild linear atelectasis or scarring at the left lung base. The visualized lung bases are otherwise clear.  Upper abdomen: The spleen is incompletely visualized, although appears prominent, measuring up to 12.9 cm in length. Probable cyst peripherally in the right hepatic lobe.  Musculoskeletal/Chest wall: No chest wall mass or suspicious osseous findings within the visualized chest. Thoracolumbar scoliosis with evidence of previous multilevel posterior thoracic surgical fusion.  IMPRESSION: 1. No acute extra cardiac findings identified. 2. The spleen is incompletely visualized, although appears prominent. 3. Thoracolumbar scoliosis with evidence of previous multilevel posterior thoracic surgical fusion. 4.  Aortic Atherosclerosis (ICD10-I70.0).   Electronically Signed By: Elsie Perone M.D. On: 10/28/2022 13:37   ECHOCARDIOGRAM  ECHOCARDIOGRAM COMPLETE 11/07/2022  Narrative ECHOCARDIOGRAM REPORT    [name] Date of Exam: 11/07/2022 Medical Rec #:  [id number]          Height:       59.0 in Accession #:    [id number]         Weight:       127.2 lb Date of Birth:  04/13/1950         BSA:          1.522 m Patient Age:    71 years           BP:           132/52 mmHg Patient Gender: F                  HR:           48 bpm. Exam Location:  Outpatient  Procedure: 2D Echo, 3D Echo, Color Doppler and Cardiac Doppler  Indications:    R06.9 DOE  History:        Patient has prior  history of Echocardiogram examinations, most recent 01/18/2021. CAD, Signs/Symptoms:Dyspnea; Risk Factors:Hypertension, Dyslipidemia and Non-Smoker. Patient denies chest pain and leg edema. She does have DOE.  Sonographer:    Annabella Cater RVT, RDCS (AE), RDMS Referring Phys: [id number] JONATHAN J BERRY  IMPRESSIONS   1. Left ventricular ejection fraction, by estimation, is 70 to 75%. Left ventricular ejection fraction by 3D volume is 73 %. The left ventricle has hyperdynamic function. The left ventricle has no regional wall motion abnormalities. There is moderate asymmetric left ventricular hypertrophy of the basal-septal segment. Left ventricular diastolic parameters are consistent with Grade I diastolic dysfunction (impaired relaxation). 2. Right ventricular systolic function is hyperdynamic. The right ventricular size is normal. 3. Left atrial size was moderately dilated. 4. The mitral valve is normal in structure. No evidence of mitral valve regurgitation. No evidence of mitral stenosis. 5. The aortic valve is tricuspid. Aortic valve regurgitation is not visualized. No aortic stenosis is present. 6. The inferior vena cava is normal in size with greater than 50% respiratory variability, suggesting right atrial pressure of 3 mmHg.  Comparison(s): EF 65-70%, intraventricular gradient. Similar function and gradients.  FINDINGS Left Ventricle: There is a small < 30 mm HG intracavitary gradient with Valsalva. Left ventricular ejection fraction, by estimation, is 70 to 75%. Left ventricular ejection fraction by 3D volume is 73 %. The left ventricle has hyperdynamic function. The left ventricle has no regional wall motion abnormalities. The left ventricular internal cavity size was normal in size. There is moderate asymmetric left ventricular hypertrophy of the basal-septal segment. Left ventricular diastolic parameters are consistent with Grade I diastolic dysfunction (impaired relaxation).  Right  Ventricle: The right ventricular size is normal. No increase in right ventricular wall thickness. Right ventricular systolic function is hyperdynamic.  Left Atrium: Left atrial size was moderately dilated.  Right Atrium: Right atrial size was normal in size.  Pericardium: Trivial pericardial effusion is present. The pericardial effusion is lateral to the left ventricle.  Mitral Valve: The mitral valve is normal in structure. No evidence of mitral valve regurgitation. No evidence of mitral valve stenosis.  Tricuspid Valve: The tricuspid valve is normal in structure. Tricuspid valve regurgitation is trivial. No evidence of tricuspid stenosis.  Aortic Valve: The aortic valve is tricuspid. Aortic valve regurgitation is not visualized. No aortic stenosis is present. Aortic valve mean gradient measures 6.5 mmHg. Aortic valve peak gradient measures 12.0 mmHg. Aortic valve area, by VTI measures 1.66 cm.  Pulmonic Valve: The pulmonic valve was normal in structure. Pulmonic valve regurgitation is mild. No evidence of pulmonic stenosis.  Aorta: The aortic root and ascending aorta are structurally normal, with no evidence of dilitation.  Venous: The inferior vena cava is normal in size with greater than 50% respiratory variability, suggesting right atrial pressure of 3 mmHg.  IAS/Shunts:  No atrial level shunt detected by color flow Doppler.   LEFT VENTRICLE PLAX 2D LVIDd:         3.82 cm         Diastology LVIDs:         1.47 cm         LV e' medial:    5.98 cm/s LV PW:         1.03 cm         LV E/e' medial:  15.2 LV IVS:        1.10 cm         LV e' lateral:   6.85 cm/s LVOT diam:     1.60 cm         LV E/e' lateral: 13.2 LV SV:         65 LV SV Index:   43 LVOT Area:     2.01 cm        3D Volume EF LV 3D EF:    Left ventricul ar ejection fraction by 3D volume is 73 %.  3D Volume EF: 3D EF:        73 % LV EDV:       101 ml LV ESV:       28 ml LV SV:        73 ml  RIGHT  VENTRICLE RV S prime:     13.30 cm/s TAPSE (M-mode): 2.6 cm  LEFT ATRIUM             Index        RIGHT ATRIUM           Index LA diam:        3.30 cm 2.17 cm/m   RA Area:     16.00 cm LA Vol (A2C):   84.7 ml 55.66 ml/m  RA Volume:   44.10 ml  28.98 ml/m LA Vol (A4C):   44.4 ml 29.18 ml/m LA Biplane Vol: 62.4 ml 41.01 ml/m AORTIC VALVE                     PULMONIC VALVE AV Area (Vmax):    1.70 cm      PV Vmax:          1.31 m/s AV Area (Vmean):   1.61 cm      PV Peak grad:     6.8 mmHg AV Area (VTI):     1.66 cm      PR End Diast Vel: 2.24 msec AV Vmax:           173.50 cm/s AV Vmean:          118.500 cm/s AV VTI:            0.392 m AV Peak Grad:      12.0 mmHg AV Mean Grad:      6.5 mmHg LVOT Vmax:         147.00 cm/s LVOT Vmean:        94.800 cm/s LVOT VTI:          0.323 m LVOT/AV VTI ratio: 0.82  AORTA Ao Root diam: 2.70 cm Ao Asc diam:  2.80 cm Ao Arch diam: 2.6 cm  MITRAL VALVE                TRICUSPID VALVE MV Area (PHT): 2.62 cm     TR Peak grad:   31.8 mmHg MV Decel Time: 289 msec     TR Vmax:  282.00 cm/s MV E velocity: 90.70 cm/s MV A velocity: 120.00 cm/s  SHUNTS MV E/A ratio:  0.76         Systemic VTI:  0.32 m Systemic Diam: 1.60 cm  Stanly Leavens MD Electronically signed by Stanly Leavens MD Signature Date/Time: 11/07/2022/2:51:25 PM    Final    MONITORS  LONG TERM MONITOR (3-14 DAYS) 02/19/2023  Narrative Patch Wear Time:  3 days and 0 hours (2024-11-23T11:28:36-0500 to 2024-11-26T12:10:21-0500)  Patient had a min HR of 37 bpm, max HR of 133 bpm, and avg HR of 53 bpm. Predominant underlying rhythm was Sinus Rhythm. Bundle Branch Block/IVCD was present. 1 run of Supraventricular Tachycardia occurred lasting 5 beats with a max rate of 133 bpm (avg 108 bpm). Isolated SVEs were frequent (7.5%, 14869), and no SVE Couplets or SVE Triplets were present. Isolated VEs were rare (<1.0%), and no VE Couplets or VE Triplets were  present.  SR/SB/ST Rare PVCs/ Occasional PACs9 7.5% burden) One short run of SVT     CARDIAC MRI  MR CARDIAC MORPHOLOGY W WO CONTRAST 04/29/2023  Narrative CLINICAL DATA:  Clinical question of left ventricular hypertrophy Study assumes BSA of 1.49 m2.  EXAM: CARDIAC MRI  TECHNIQUE: The patient was scanned on a 1.5 Tesla GE magnet. A dedicated cardiac coil was used. Functional imaging was done using Fiesta sequences. 2,3, and 4 chamber views were done to assess for RWMA's. Modified Simpson's rule using a short axis stack was used to calculate an ejection fraction on a dedicated work [employer]. The patient received 8 cc of Gadavist . After 10 minutes inversion recovery sequences were used to assess for infiltration and scar tissue. Flow quantification was performed 2 times during this examination with flow quantification performed at the levels of the ascending aorta above the valve, pulmonary artery above the valve.  CONTRAST:  8 cc  of Gadavist   FINDINGS: 1. Normal left ventricular size, with LVEDD 38 mm, and LVEDVi 68 mL/m2.  Mild increase in basal septal hypertrophy (12 mm) with myocardial mass index of 59 g/m2.  Normal left ventricular systolic function (LVEF =70%). There are no regional wall motion abnormalities.  Left ventricular parametric mapping notable for normal ECV and T2.  There is no late gadolinium enhancement in the left ventricular myocardium.  2.  Dilated right ventricular size with RVEDVI 108 mL/m2.  Normal right ventricular thickness.  Normal right ventricular systolic function (RVEF =60%). There are no regional wall motion abnormalities or aneurysms.  3.  Moderate right atrial enlargement.  Normal left atrial size.  4. Normal size of the aortic root, ascending aorta and pulmonary artery.  5. Valve assessment:  Aortic Valve: Tri-leaflet valve. No significant regurgitation. Regurgitant fraction 3%.  Pulmonic Valve:  No significant regurgitation. Regurgitant fraction < 1%.  Tricuspid Valve: No significant regurgitation. Regurgitant fraction 3%.  Mitral Valve: Anterior leaflet thickening. No significant regurgitation. Regurgitant fraction 4%.  6. Normal pericardium. Small pericardial effusion around the ventricular base. No evidence of increased pericardial pressures.  7. Grossly, no extracardiac findings. Recommended dedicated study if concerned for non-cardiac pathology.  8. Breath-hold Artifacts noted.  IMPRESSION: 1. Study does not meet the diagnostic criteria for hypertrophic cardiomyopathy. No evidence of infiltrative disease.  2.  Right atrial and right ventricular dilation.  3.  No significant valve regurgitation.  Stanly Leavens MD   Electronically Signed By: Stanly Leavens M.D. On: 04/29/2023 21:30   ______________________________________________________________________________________________     Recent Labs: 03/08/2024: BUN 62; Creatinine, Ser 1.45;  Hemoglobin 8.8; Platelets 193; Potassium 4.3; Sodium 140  Recent Lipid Panel    Component Value Date/Time   CHOL 152 03/05/2022 0901   TRIG 49 03/05/2022 0901   HDL 81 03/05/2022 0901   CHOLHDL 1.9 03/05/2022 0901   LDLCALC 60 03/05/2022 0901    History of Present Illness    74 year old female with with the above past medical history including CAD s/p DES x 2-mLAD in 2010 in [place], carotid artery disease, hypertension, hyperlipidemia,CKD stage IIIb and type 2 diabetes.    She has a history of CAD s/p DES x 2-LAD in February 2010 in [place].  Myoview in 2019 was nonischemic.  Echocardiogram May 2022 showed EF 65 to 70%, normal LV function, no RWMA, G1 DD, normal RV, no significant valvular abnormalities. ABIs in 04/2022 were essentially normal.  Cardiac PET stress test  in 10/2022 was low risk.  Repeat echocardiogram in 10/2022 showed EF 70 to 75%, hyperdynamic LV function, no RWMA, moderate asymmetric LVH, G1  DD, hyperdynamic RV, no significant valvular abnormalities no significant change from prior study.  Carotid ultrasound in 11/2022 revealed 1 to 39% R ICA stenosis, 40 to 59% LICA stenosis.  Repeat study was recommended in 1 year. Cardiac monitor in 02/2023 revealed predominantly normal sinus rhythm, BBB/IVCD, 1 run of SVT lasting 5 beats, occasional PACs, rare PVCs, no significant arrhythmia. MRI in 04/2023 was negative for HCM, there was no evidence of infiltrative disease. She was last seen in the office on 02/03/2024 and reported ongoing dyspnea on exertion. It was noted that her symptoms were likely noncardiac given overall reassuring cardiac workup. She was evaluated in the ED on 03/08/2024 in the setting of right sided chest pain. EKG was stable. Troponin was negative x2, D.Dimer was negative. Chest x-ray was unremarkable. She was discharged home in stable condition.     She presents today for follow-up accompanied by her friend.  Since her last visit and recent ED visit she has been stable from a cardiac standpoint.  Her chest pain has resolved.  She was told that this was likely costochondritis.  She denies any palpitations, dizziness, dyspnea, edema, PND, comfortable weight gain.  Prominent murmur on exam.  Will update echocardiogram.  History of significant bruising.  Previously followed by hematology, recommend follow-up with hematology.  She takes aspirin daily.  Follow-up in 07/2024 with APP.  Should she have recurrent chest pain, low threshold to consider repeating stress test.  Will update echocardiogram as below.  1. CAD/dyspnea on exertion/fatigue: S/p DES x 2-LAD in February 2010.  Myoview in 2019 was nonischemic. Echocardiogram May 2022 showed EF 65 to 70%, normal LV function, no RWMA, G1 DD, normal RV, no significant valvular abnormalities. Cardiac PET stress test in 10/2022 was negative for ischemia. Repeat echocardiogram in 10/2022 showed EF 70 to 75%, hyperdynamic LV function, no RWMA, moderate  asymmetric LVH, G1 DD, hyperdynamic RV, no significant valvular abnormalities no significant change from prior study. MRI in 04/2023 was negative for HCM, there was no evidence of infiltrative disease. She continues to note mild dyspnea on exertion, unchanged from prior visits.  Given extensive reassuring cardiac workup, consider noncardiac cause of symptoms.  Continue aspirin, enalapril , nifedipine , and Lipitor.   2. Bradycardia: Cardiac monitor in 02/2023 revealed predominantly normal sinus rhythm, BBB/IVCD, 1 run of SVT lasting 5 beats, occasional PACs, rare PVCs, no significant arrhythmia.  She continues to note intermittent bradycardia, generally asymptomatic.  Continue to monitor.  She is on any AV nodal blocking  agents.   3. LVH/cardiac murmur: MRI in 04/2023 was negative for HCM, there was no evidence of infiltrative disease. Euvolemic and well compensated on exam.    4. Carotid artery disease: Carotid ultrasound in 12/05/2022 revealed 1 to 39% R ICA stenosis, 40 to 59% LICA stenosis.  Repeat carotid ultrasound scheduled for 11/17/2023.  Asymptomatic.  Continue aspirin, Lipitor.   5. Hypertension: BP well controlled. Continue current antihypertensive regimen.    6. Hyperlipidemia: LDL was 49 in 09/2023.  Continue Lipitor.   7. Type 2 diabetes: A1c was 6.5 in 09/2023. Monitored and manged per PCP.    8. CKD stage IIIb:  9. Anemia: Hemoglobin was 10.1 in 02/2023.  She has a history of anemia, previously followed by hematology.  Recommend continued follow-up with PCP.     10. Disposition: Follow-up in    Home Medications    Current Outpatient Medications  Medication Sig Dispense Refill   albuterol  (VENTOLIN  HFA) 108 (90 Base) MCG/ACT inhaler Inhale 2 puffs into the lungs every 6 (six) hours as needed for wheezing or shortness of breath. 8 g 6   aspirin EC 81 MG tablet Take 81 mg by mouth daily.     atorvastatin  (LIPITOR) 80 MG tablet Take 1 tablet by mouth once daily 90 tablet 1    Carboxymethylcellulose Sodium (EYE DROPS OP) Apply to eye. Refresh Drops as needed before bed     Carboxymethylcellulose Sodium (THERATEARS OP) Apply to eye in the morning.     Cholecalciferol (VITAMIN D-3 PO) Take 1 Capful by mouth daily.     citalopram (CELEXA) 20 MG tablet Take 20 mg by mouth daily.     CRANBERRY PO Take 4,200 capsules by mouth daily.     enalapril  (VASOTEC ) 5 MG tablet Take 1 tablet (5 mg total) by mouth daily. 90 tablet 3   Ferrous Sulfate (IRON) 325 (65 Fe) MG TABS Take 1 tablet by mouth daily.     gabapentin (NEURONTIN) 300 MG capsule Take 300 mg by mouth at bedtime.      Lactobacillus (PROBIOTIC ACIDOPHILUS PO) Take by mouth.     lubiprostone (AMITIZA) 24 MCG capsule Take 24 mcg by mouth 2 (two) times daily with a meal.     NIFEdipine  (PROCARDIA  XL/NIFEDICAL XL) 60 MG 24 hr tablet Take 1 tablet (60 mg total) by mouth daily. 90 tablet 3   oxybutynin (DITROPAN-XL) 5 MG 24 hr tablet Take 5 mg by mouth at bedtime.     PREVIDENT 5000 DRY MOUTH 1.1 % GEL dental gel Place 1 Application onto teeth daily.     repaglinide (PRANDIN) 0.5 MG tablet      vitamin C (ASCORBIC ACID) 500 MG tablet Take 500 mg by mouth daily.     No current facility-administered medications for this visit.     Review of Systems    ***.  All other systems reviewed and are otherwise negative except as noted above.    Physical Exam    VS:  BP 128/80 (Cuff Size: Normal)   Pulse 85   Ht 4' 11 (1.499 m)   Wt 110 lb (49.9 kg)   SpO2 97%   BMI 22.22 kg/m  GEN: Well nourished, well developed, in no acute distress. HEENT: normal. Neck: Supple, no JVD, carotid bruits, or masses. Cardiac: RRR, no murmurs, rubs, or gallops. No clubbing, cyanosis, edema.  Radials/DP/PT 2+ and equal bilaterally.  Respiratory:  Respirations regular and unlabored, clear to auscultation bilaterally. GI: Soft, nontender, nondistended, BS + x 4. MS:  no deformity or atrophy. Skin: warm and dry, no rash. Neuro:  Strength  and sensation are intact. Psych: Normal affect.  Accessory Clinical Findings    ECG personally reviewed by me today -    - no acute changes.   Lab Results  Component Value Date   WBC 7.2 03/08/2024   HGB 8.8 (L) 03/08/2024   HCT 26.8 (L) 03/08/2024   MCV 89.6 03/08/2024   PLT 193 03/08/2024   Lab Results  Component Value Date   CREATININE 1.45 (H) 03/08/2024   BUN 62 (H) 03/08/2024   NA 140 03/08/2024   K 4.3 03/08/2024   CL 106 03/08/2024   CO2 21 (L) 03/08/2024   Lab Results  Component Value Date   ALT 13 02/04/2023   AST 14 (L) 02/04/2023   ALKPHOS 49 02/04/2023   BILITOT 0.5 02/04/2023   Lab Results  Component Value Date   CHOL 152 03/05/2022   HDL 81 03/05/2022   LDLCALC 60 03/05/2022   TRIG 49 03/05/2022   CHOLHDL 1.9 03/05/2022    No results found for: HGBA1C  Assessment & Plan    1.  ***      Damien JAYSON Braver, NP 03/21/2024, 4:25 PM       [1]  Allergies Allergen Reactions   Clindamycin/Lincomycin    Doxycycline    Penicillins    "

## 2024-03-21 NOTE — Patient Instructions (Signed)
 Medication Instructions:  Your physician recommends that you continue on your current medications as directed. Please refer to the Current Medication list given to you today.  *If you need a refill on your cardiac medications before your next appointment, please call your pharmacy*  Lab Work: NONE ordered at this time of appointment   Testing/Procedures: Your physician has requested that you have an echocardiogram. Echocardiography is a painless test that uses sound waves to create images of your heart. It provides your doctor with information about the size and shape of your heart and how well your hearts chambers and valves are working. This procedure takes approximately one hour. There are no restrictions for this procedure. Please do NOT wear cologne, perfume, aftershave, or lotions (deodorant is allowed). Please arrive 15 minutes prior to your appointment time.  Please note: We ask at that you not bring children with you during ultrasound (echo/ vascular) testing. Due to room size and safety concerns, children are not allowed in the ultrasound rooms during exams. Our front office staff cannot provide observation of children in our lobby area while testing is being conducted. An adult accompanying a patient to their appointment will only be allowed in the ultrasound room at the discretion of the ultrasound technician under special circumstances. We apologize for any inconvenience.    Follow-Up: At [facility], you and your health needs are our priority.  As part of our continuing mission to provide you with exceptional heart care, our providers are all part of one team.  This team includes your primary Cardiologist (physician) and Advanced Practice Providers or APPs (Physician Assistants and Nurse Practitioners) who all work together to provide you with the care you need, when you need it.  Your next appointment:    Follow up May 2026 Dr. Court or Damien Braver NP  We recommend  signing up for the patient portal called MyChart.  Sign up information is provided on this After Visit Summary.  MyChart is used to connect with patients for Virtual Visits (Telemedicine).  Patients are able to view lab/test results, encounter notes, upcoming appointments, etc.  Non-urgent messages can be sent to your provider as well.   To learn more about what you can do with MyChart, go to [email or web address].

## 2024-03-23 ENCOUNTER — Encounter: Payer: Self-pay | Admitting: Nurse Practitioner

## 2024-03-28 ENCOUNTER — Ambulatory Visit: Admitting: Cardiovascular Disease

## 2024-03-29 ENCOUNTER — Ambulatory Visit (HOSPITAL_COMMUNITY)
Admission: RE | Admit: 2024-03-29 | Discharge: 2024-03-29 | Disposition: A | Discharge status: Home / Self Care | Source: Ambulatory Visit | Attending: Nurse Practitioner | Admitting: Nurse Practitioner

## 2024-03-29 DIAGNOSIS — I517 Cardiomegaly: Secondary | ICD-10-CM | POA: Insufficient documentation

## 2024-03-30 LAB — ECHOCARDIOGRAM COMPLETE
Area-P 1/2: 2.33 cm2
S' Lateral: 1.79 cm

## 2024-03-31 ENCOUNTER — Ambulatory Visit: Payer: Self-pay | Admitting: Nurse Practitioner

## 2024-07-05 ENCOUNTER — Encounter (INDEPENDENT_AMBULATORY_CARE_PROVIDER_SITE_OTHER): Admitting: Ophthalmology
